# Patient Record
Sex: Male | Born: 1950 | Race: White | Hispanic: No | Marital: Married | Smoking: Never smoker
Health system: Southern US, Community
[De-identification: ages and names within clinical notes are randomized; demographics above are authoritative.]

## PROBLEM LIST (undated history)

## (undated) DIAGNOSIS — G47419 Narcolepsy without cataplexy: Secondary | ICD-10-CM

## (undated) DIAGNOSIS — R7303 Prediabetes: Secondary | ICD-10-CM

## (undated) DIAGNOSIS — K429 Umbilical hernia without obstruction or gangrene: Secondary | ICD-10-CM

## (undated) DIAGNOSIS — R140 Abdominal distension (gaseous): Secondary | ICD-10-CM

## (undated) DIAGNOSIS — M5430 Sciatica, unspecified side: Secondary | ICD-10-CM

## (undated) DIAGNOSIS — I10 Essential (primary) hypertension: Secondary | ICD-10-CM

## (undated) DIAGNOSIS — G4730 Sleep apnea, unspecified: Secondary | ICD-10-CM

## (undated) DIAGNOSIS — I4510 Unspecified right bundle-branch block: Secondary | ICD-10-CM

## (undated) DIAGNOSIS — E785 Hyperlipidemia, unspecified: Secondary | ICD-10-CM

## (undated) DIAGNOSIS — I2510 Atherosclerotic heart disease of native coronary artery without angina pectoris: Secondary | ICD-10-CM

## (undated) HISTORY — DX: Essential (primary) hypertension: I10

## (undated) HISTORY — PX: TONSILLECTOMY: SUR1361

## (undated) HISTORY — DX: Hyperlipidemia, unspecified: E78.5

## (undated) HISTORY — PX: ESOPHAGEAL DILATION: SHX303

## (undated) HISTORY — PX: HERNIA REPAIR: SHX51

## (undated) HISTORY — PX: CORONARY ARTERY BYPASS GRAFT: SHX141

## (undated) HISTORY — DX: Atherosclerotic heart disease of native coronary artery without angina pectoris: I25.10

## (undated) HISTORY — DX: Unspecified right bundle-branch block: I45.10

## (undated) HISTORY — DX: Sleep apnea, unspecified: G47.30

## (undated) HISTORY — PX: COLONOSCOPY W/ BIOPSIES AND POLYPECTOMY: SHX1376

## (undated) HISTORY — DX: Abdominal distension (gaseous): R14.0

## (undated) HISTORY — PX: MULTIPLE TOOTH EXTRACTIONS: SHX2053

---

## 1976-10-05 HISTORY — PX: NASAL SINUS SURGERY: SHX719

## 1999-06-06 ENCOUNTER — Ambulatory Visit (HOSPITAL_COMMUNITY): Admission: RE | Admit: 1999-06-06 | Discharge: 1999-06-06 | Payer: Self-pay | Admitting: Gastroenterology

## 1999-06-06 ENCOUNTER — Encounter (INDEPENDENT_AMBULATORY_CARE_PROVIDER_SITE_OTHER): Payer: Self-pay

## 2000-10-05 DIAGNOSIS — I2510 Atherosclerotic heart disease of native coronary artery without angina pectoris: Secondary | ICD-10-CM

## 2000-10-05 HISTORY — DX: Atherosclerotic heart disease of native coronary artery without angina pectoris: I25.10

## 2001-01-27 ENCOUNTER — Inpatient Hospital Stay (HOSPITAL_COMMUNITY): Admission: RE | Admit: 2001-01-27 | Discharge: 2001-02-01 | Payer: Self-pay | Admitting: Cardiology

## 2001-01-29 ENCOUNTER — Encounter: Payer: Self-pay | Admitting: Cardiothoracic Surgery

## 2001-01-30 ENCOUNTER — Encounter: Payer: Self-pay | Admitting: Thoracic Surgery (Cardiothoracic Vascular Surgery)

## 2001-03-04 ENCOUNTER — Encounter (HOSPITAL_COMMUNITY): Admission: RE | Admit: 2001-03-04 | Discharge: 2001-05-04 | Payer: Self-pay | Admitting: Cardiology

## 2001-05-05 ENCOUNTER — Encounter (HOSPITAL_COMMUNITY): Admission: RE | Admit: 2001-05-05 | Discharge: 2001-05-25 | Payer: Self-pay | Admitting: Cardiology

## 2004-12-05 ENCOUNTER — Encounter (INDEPENDENT_AMBULATORY_CARE_PROVIDER_SITE_OTHER): Payer: Self-pay | Admitting: Specialist

## 2004-12-05 ENCOUNTER — Ambulatory Visit (HOSPITAL_COMMUNITY): Admission: RE | Admit: 2004-12-05 | Discharge: 2004-12-05 | Payer: Self-pay | Admitting: Gastroenterology

## 2005-05-14 ENCOUNTER — Encounter: Payer: Self-pay | Admitting: Internal Medicine

## 2006-08-25 ENCOUNTER — Ambulatory Visit (HOSPITAL_BASED_OUTPATIENT_CLINIC_OR_DEPARTMENT_OTHER): Admission: RE | Admit: 2006-08-25 | Discharge: 2006-08-25 | Payer: Self-pay | Admitting: Surgery

## 2008-06-28 ENCOUNTER — Encounter: Payer: Self-pay | Admitting: Internal Medicine

## 2008-07-10 ENCOUNTER — Ambulatory Visit: Payer: Self-pay | Admitting: Internal Medicine

## 2008-07-10 DIAGNOSIS — G4733 Obstructive sleep apnea (adult) (pediatric): Secondary | ICD-10-CM | POA: Insufficient documentation

## 2008-07-10 DIAGNOSIS — J309 Allergic rhinitis, unspecified: Secondary | ICD-10-CM | POA: Insufficient documentation

## 2008-07-10 DIAGNOSIS — I10 Essential (primary) hypertension: Secondary | ICD-10-CM

## 2008-10-09 ENCOUNTER — Ambulatory Visit: Payer: Self-pay | Admitting: Internal Medicine

## 2010-03-11 ENCOUNTER — Ambulatory Visit: Payer: Self-pay | Admitting: Internal Medicine

## 2010-03-11 DIAGNOSIS — G47411 Narcolepsy with cataplexy: Secondary | ICD-10-CM

## 2010-05-07 ENCOUNTER — Telehealth: Payer: Self-pay | Admitting: Internal Medicine

## 2010-05-15 ENCOUNTER — Encounter: Payer: Self-pay | Admitting: Internal Medicine

## 2010-05-28 ENCOUNTER — Telehealth (INDEPENDENT_AMBULATORY_CARE_PROVIDER_SITE_OTHER): Payer: Self-pay | Admitting: *Deleted

## 2010-06-23 ENCOUNTER — Ambulatory Visit (HOSPITAL_BASED_OUTPATIENT_CLINIC_OR_DEPARTMENT_OTHER)
Admission: RE | Admit: 2010-06-23 | Discharge: 2010-06-23 | Payer: Self-pay | Source: Home / Self Care | Admitting: Internal Medicine

## 2010-06-24 ENCOUNTER — Encounter: Payer: Self-pay | Admitting: Internal Medicine

## 2010-06-28 ENCOUNTER — Ambulatory Visit: Payer: Self-pay | Admitting: Internal Medicine

## 2010-07-11 ENCOUNTER — Encounter: Payer: Self-pay | Admitting: Internal Medicine

## 2010-08-12 ENCOUNTER — Telehealth (INDEPENDENT_AMBULATORY_CARE_PROVIDER_SITE_OTHER): Payer: Self-pay | Admitting: *Deleted

## 2010-11-06 NOTE — Progress Notes (Signed)
Summary: ? why was PFT sched- LMTCB x 1 -spouse returned call  Phone Note Call from Patient Call back at Home Phone [phone]   Caller: Franki Cabot (wife) Call For: young Summary of Call: patient was scheduled for a pft on 11/10.  pts wife did not know anything about it. she said she talked to libby friday and libby was gonna call her back before this was scheduled. she didnt know this was approved by insurance or anything. patient doesnt know why he is sched for a pft either. no one told him he needed one.  the wife would like to talk to libby about this since she talked to libby friday.  Initial call taken by: Valinda Hoar,  August 12, 2010 4:53 PM  Follow-up for Phone Call        I am unsure who ordered the PFT or why it was sched- looks like Lib sched this for pt on 08/08/10 and comments in IDX state something about the pt passing out last time?? I called spouse and had to [facility]  August 12, 2010 5:07 PM  [facility]Michel Bickers [facility]  August 13, 2010 11:26 AM   Additional Follow-up for Phone Call Additional follow up Details #1::        not sure what pt's wife needs. says sleep study was code wrong but needs to rsc pft (but not before she speaks to libby re: prior [place]). call dellie Caris at home #. caller understands that she may not get a call back today. Tivis Ringer, CNA  August 14, 2010 5:03 PM    Additional Follow-up for Phone Call Additional follow up Details #2::    spoke to wife she is aware that pt was not to be set up for pfts we sent info to ins to help cover the mslt he had done back in aug and wife was confused over another phone call she got  Follow-up by: Oneita Jolly,  August 14, 2010 5:17 PM

## 2010-11-06 NOTE — Medication Information (Signed)
Summary: Prior [employer] for Methylphenidate/[facility]  Prior [facility] for Methylphenidate/[facility]   Imported By: Sherian Rein 10/09/2010 14:43:08  _____________________________________________________________________  External Attachment:    Type:   Image     Comment:   External Document

## 2010-11-06 NOTE — Assessment & Plan Note (Signed)
Summary: Aaron Humphrey/need new equip/apc   Copy to:  Dr. Tally Joe Primary Provider/Referring Provider:  Dr. Tally Joe  CC:  Follow up-Aaron; needs new CPAP machine([facility]).  History of Present Illness: 07/10/08- OSA, allergic rhinitis Didn't like sustained action Ritalin, preferring to work with short actiing form. Trying to maintain good Aaron hygiene  and toget adequate Aaron. Incidental skin bump left face, from cpap- to see a dermatologist. We discussed skin care, pressure. Denies chest pain, palpitation, syncope.  March 11, 2010- OSA, allergic rhinits, CAD/ CABG.....wife here Continues CPAP 13, [facility]- he uses it all night every night and quality of life is improved. He uses Provigil on weekdays and the methylphenidate 10  on weekends. He tolerates these well. Wife describes him as "cloudy in the afternoon". Meds helpful on long drives..  Denies chest pain, palpitation, syncope, mood change.     Current Medications (verified): 1)  Atenolol 25 Mg Tabs (Atenolol) .Marland Kitchen.. 1 Once Daily 2)  Provigil 200 Mg Tabs (Modafinil) .Marland Kitchen.. 1 Once Daily 3)  Lipitor 40 Mg Tabs (Atorvastatin Calcium) .... At Bedtime 4)  Niaspan 1000 Mg Cr-Tabs (Niacin (Antihyperlipidemic)) .... Take 2 By Mouth At Bedtime 5)  Aspirin Adult Low Strength 81 Mg Tbec (Aspirin) .... 2 At Bedtime 6)  Aleve 220 Mg Caps (Naproxen Sodium) .... As Needed 7)  Metadate Cd 10 Mg Cr-Caps (Methylphenidate Hcl) .... Take 1-4 Once Daily As Needed 8)  Cpap .... Ahc Set On 13  Allergies (verified): 1)  ! 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 - 2002 inquinal hernia repair esophageal stricture dilatation x 3  Family History: Last updated: 07/10/2008 Father had a stroke  Social  History: Last updated: 07/10/2008 Never smoker No ETOH Married  No children  Review of Systems      See HPI  The patient denies shortness of breath with activity, shortness of breath at rest, productive cough, non-productive cough, coughing up blood, chest pain, irregular heartbeats, acid heartburn, indigestion, loss of appetite, weight change, abdominal pain, difficulty swallowing, sore throat, tooth/dental problems, headaches, nasal congestion/difficulty breathing through nose, and sneezing.    Vital Signs:  Patient profile:   60 year old male Height:      74 inches Weight:      201 pounds BMI:     25.90 O2 Sat:      97 % on Room air Pulse rate:   60 / minute BP sitting:   122 / 74  (left arm) Cuff size:   regular  Vitals Entered By: Reynaldo Minium CMA (March 11, 2010 2:56 PM)  O2 Flow:  Room air  Physical Exam  Additional Exam:  General: A/Ox3; pleasant and cooperative, NAD, tall, lean man SKIN: clear NODES: no lymphadenopathy HEENT: [place]/AT, EOM- WNL, Conjuctivae- clear, PERRLA, TM-WNL, Nose- red mucoid edema without polyps or erosion., Throat- clear and wnl, Mallampati  II NECK: Supple w/ fair ROM, JVD- none, normal carotid impulses w/o bruits Thyroid- normal to palpation CHEST: Clear to P&A HEART: RRR, no m/g/r heard, puls slow for taking stimulant. ABDOMEN: Soft and nl;  ZOX:WRUE, nl pulses, no edema  NEURO: Grossly intact to observation      Impression & Recommendations:  Problem # 1:  Aaron APNEA (ICD-780.57)  Obstructive Aaron apnea- pressure has been appropriate but he needs replacement  CPAP machine  and supples  Problem # 2:  HYPERSOMNIA (ICD-780.54)  This is probably but not defintiesly secondary to his OSA. Aaron hygiene seems adequate. We discussed use of Provigil and metadate carefully in context of his CAD. Use seems appropriate. He will try Nuvigil for comparison.  Medications Added to Medication List This Visit: 1)  Nuvigil 250 Mg Tabs  (Armodafinil) .Marland Kitchen.. 1 daily as needed 2)  Niaspan 1000 Mg Cr-tabs (Niacin (antihyperlipidemic)) .... Take 2 by mouth at bedtime 3)  Metadate Cd 10 Mg Cr-caps (Methylphenidate hcl) .... Take 1-4 once daily as needed 4)  Cpap  .... Ahc set on 13 5)  Cpap 13 Replacement, Humidifer, Mask of Choice, Supplies   Other Orders: Est. Patient Level IV (16109) DME Referral (DME)  Patient Instructions: 1)  Please schedule a follow-up appointment in 1 year. 2)  See [facility] for help as needed getting repacement CPAP machine, mask and supplies 3)  Try sample/ script/ discount card for Nuvigil 250 mg- 1 daily if needed 4)  Refill for Metadate Prescriptions: NUVIGIL 250 MG TABS (ARMODAFINIL) 1 daily as needed  #30 x 5   Entered and Authorized by:   Waymon Budge MD   Signed by:   Waymon Budge MD on 03/11/2010   Method used:   Print then Give to Patient   RxID:   [id number] METADATE CD 10 MG CR-CAPS (METHYLPHENIDATE HCL) take 1-4 once daily as needed  #120 x 0   Entered and Authorized by:   Waymon Budge MD   Signed by:   Waymon Budge MD on 03/11/2010   Method used:   Print then Give to Patient   RxID:   [id number]

## 2010-11-06 NOTE — Letter (Signed)
Summary: [facility]  [facility]   Imported By: Lester [place] 07/01/2010 08:14:22  _____________________________________________________________________  External Attachment:    Type:   Image     Comment:   External Document

## 2010-11-06 NOTE — Progress Notes (Signed)
Summary: sleep study  Phone Note Call from Patient Call back at Home Phone [phone]   Caller: Spouse Call For: young Summary of Call: Checking on the status of order for sleep study. Initial call taken by: Darletta Moll,  May 28, 2010 9:43 AM  Follow-up for Phone Call        Spoke with pt's spouse.  She states that she and pt were unaware of appt for mslt- I advised that this has been sched for 9/20 at 7 am at [facility].  She states that it was supposed to be at [facility], and also that this date will not work for pt.  Wants to speak with [facility] to make sure it's not supposed to be at [facility] and also to resched.  Will forward to [facility], thanks Follow-up by: Vernie Murders,  May 28, 2010 10:09 AM  Additional Follow-up for Phone Call Additional follow up Details #1::        wife aware the appt is 06/24/10 unless sleep ctr changes it  Additional Follow-up by: Oneita Jolly,  May 28, 2010 11:21 AM

## 2010-11-06 NOTE — Progress Notes (Signed)
Summary: prior auth  Phone Note Call from Patient Call back at [phone] cell   Caller: Patient Call For: young Reason for Call: Talk to Nurse Summary of Call: Nuvigil needs prior authorization and metholtheodate written on Kune 7.2011 - pt has new insurance and needs to go thru [facility] [phone] Name of pt and DOB. Initial call taken by: Eugene Gavia,  May 07, 2010 2:38 PM  Follow-up for Phone Call        called [employer] and requested PA for these meds.  Case # for Ronne Binning is [id number]  case # for methylphenidate is [id number]  waiting on fax from [facility] with PA paperwork for CY to fill out.   Aundra Millet Reynolds LPN  May 07, 2010 4:41 PM   received paperwork and gave to [facility] to give to CY to fill out.  Arman Filter LPN  May 08, 2010 8:47 AM   Additional Follow-up for Phone Call Additional follow up Details #1::        Done Additional Follow-up by: Waymon Budge MD,  May 09, 2010 9:15 AM     Appended Document: prior auth I gave him nuvigil to try. He needs to decide if he wants that or his methylphenidate, but not both.  Appended Document: prior auth LMTCB.  Appended Document: prior auth Pt returned my call  and requested that I call him back at his home number; attempted to call and unable to reach pt or leave message. Also, attempted to call pt at his cell number provided; unable to reach pt-LMTCB.   Appended Document: prior Berkley Harvey Spoke with patients spouse; states that patient has always taken Methylphenidate and Provigil together-until CDY changed Provigil to Nuvigil; Wife states that patient needs the Provigil RX back as it works better and needs to continue taking Methylphenidate as it helps when patient is driving/working. Will speak to CDY about this and obtain PA for med if needed.    Pts wife would like to have 30 day rx sent to [facility] and 90 day supply to [facility].  Appended Document: prior auth-Rx's for Provigil Spoke with CDY-ok to let  pt have RX's for Provigil again 30 day and 90 day Rx's printed. Also, will start PA process for Methylphenidate Rx.  Pt take both Provigil and Methylphenidate due to his work; takes provigil on weekends.Reynaldo Minium CMA  May 14, 2010 4:43 PM   Clinical Lists Changes  Medications: Changed medication from NUVIGIL 250 MG TABS (ARMODAFINIL) 1 daily as needed to PROVIGIL 200 MG TABS (MODAFINIL) take 1 by mouth once daily - Signed Rx of PROVIGIL 200 MG TABS (MODAFINIL) take 1 by mouth once daily;  #30 x 0;  Signed;  Entered by: Reynaldo Minium CMA;  Authorized by: Waymon Budge MD;  Method used: Print then Give to Patient Rx of PROVIGIL 200 MG TABS (MODAFINIL) take 1 by mouth once daily;  #90 x 3;  Signed;  Entered by: Reynaldo Minium CMA;  Authorized by: Waymon Budge MD;  Method used: Print then Give to Patient    Prescriptions: PROVIGIL 200 MG TABS (MODAFINIL) take 1 by mouth once daily  #90 x 3   Entered by:   Reynaldo Minium CMA   Authorized by:   Waymon Budge MD   Signed by:   Reynaldo Minium CMA on 05/14/2010   Method used:   Print then Give to Patient   RxID:   [id number] PROVIGIL 200 MG TABS (MODAFINIL) take 1  by mouth once daily  #30 x 0   Entered by:   Reynaldo Minium CMA   Authorized by:   Waymon Budge MD   Signed by:   Reynaldo Minium CMA on 05/14/2010   Method used:   Print then Give to Patient   RxID:   [id number]    Appended Document: prior auth received denial letter and appeal form from [employer].  stapled "packet" has been placed on CDY's cart and this append forwarded to Katie's box, who is aware of the letter/form.  Appended Document: prior auth denial came through and states pt needs to have MSLT-pt has agreed to do this. CDY has been notified of this to place order.  Appended Document: Orders Update Katie called patient- expained insurance won't cover Nuvigil/ methylphenidate for dx of hypersomnia w/ OSA. We need to establish separate dx of residual  primary hypersomnia after treatment for OSA by doing MSLT. He agrees.  MSLT scheduled for 06/24/10. wife aware of appt. Clinical Lists Changes  Orders: Added new Referral order of Sleep Disorder Referral (Sleep Disorder) - Signed

## 2010-11-06 NOTE — Medication Information (Signed)
Summary: Methylphenidate Denied/[facility]  Methylphenidate Denied/[facility]   Imported By: Sherian Rein 10/09/2010 14:45:58  _____________________________________________________________________  External Attachment:    Type:   Image     Comment:   External Document

## 2010-12-18 ENCOUNTER — Telehealth: Payer: Self-pay | Admitting: Internal Medicine

## 2010-12-23 NOTE — Progress Notes (Signed)
Summary: pt needs new prescription for Metadate CD 10 mg--lmomtcb  Phone Note Call from Patient   Caller: Patient Call For: Aaron Humphrey Summary of Call: Patient phoned stated that Dr Maple Hudson gave him a prescription for Metadhate CD 10 MG in June but he had so many problems with his insurance he never had this filled. Now that he has had the sleep study he feels that he needs this medication but it requires prior auth from the doctors office. The new prescription needs to be sent to [facility]. Patient can be reached at [phone] Initial call taken by: Vedia Coffer,  December 18, 2010 10:29 AM  Follow-up for Phone Call        Looks like this was denied by [facility] on 05/15/2010 due to pt not having a dx of ADD/ADHD, narcolepsy, idiopathis hypersomnolence, MS related fatigue or depression. Pt had a sleep study done and looks like he may be on cpap. LMOMTCB X1 for pt to find out more info [facility]  December 18, 2010 11:30 AM   Additional Follow-up for Phone Call Additional follow up Details #1::        Patient calling back.  He is going to lunch won't be able to be reached until 1:15pm [employer]  December 18, 2010 12:04 PM lmomtcb Carver Fila  December 18, 2010 1:43 PM     Additional Follow-up for Phone Call Additional follow up Details #2::    called and spoke with pt.  pt is requesting rx for Metadhate CD 10 MG be sent to [facility] pharmacy. Pt states this was previously denied by ins but pt states he had recent sleep study and now believes ins will pay for rx.  will forward message to CY to address.Marland Kitchen Marland KitchenAundra Millet Reynolds LPN  December 18, 2010 2:39 PM    Per CDY-ok to copy previous script; please bring Rx to me and I will take care of the rest of the message as CDY needs sleep study. Thanks.Reynaldo Minium CMA  December 18, 2010 3:30 PM   Per TD have Florentina Addison print off the script and have cdy to fill out. FYI pt wants rx to go to [facility] ? 90 day supply Mindy Edward Jolly  December 18, 2010 4:08 PM   Additional Follow-up for Phone  Call Additional follow up Details #3:: Details for Additional Follow-up Action Taken: Rx printed and placed on CDY's cart to have signed then faxed to [facility]; also placed sleep study with message for CDY to review.Reynaldo Minium CMA  December 19, 2010 2:52 PM   OK- cdy  Prescriptions: METADATE CD 10 MG CR-CAPS (METHYLPHENIDATE HCL) take 1-4 once daily as needed  #120 x 0   Entered by:   Reynaldo Minium CMA   Authorized by:   Waymon Budge MD   Signed by:   Reynaldo Minium CMA on 12/19/2010   Method used:   Print then Give to Patient   RxID:   [id number]

## 2011-01-02 ENCOUNTER — Telehealth: Payer: Self-pay | Admitting: Internal Medicine

## 2011-01-02 MED ORDER — METHYLPHENIDATE HCL ER (CD) 10 MG PO CPCR: ORAL_CAPSULE | ORAL | Status: DC

## 2011-01-02 NOTE — Telephone Encounter (Signed)
Rx was printed and placed on CDY's cart to be signed.

## 2011-01-02 NOTE — Telephone Encounter (Signed)
Spoke with Aaron Humphrey-aware of Rx at front for pick up.Aaron Humphrey

## 2011-01-05 ENCOUNTER — Telehealth: Payer: Self-pay | Admitting: *Deleted

## 2011-01-05 NOTE — Telephone Encounter (Signed)
Ms Weick returning Lori's call. She will be here this afternoon to pick up written prescription for Shanda Howells.Vedia Coffer

## 2011-01-05 NOTE — Telephone Encounter (Signed)
Follow-up on phone call from 12/22/2010 ( msg was still in the triage box in EMR). Tried to contact the patient to see if anything further is needed on Ritalin RX. Is PA needed and which pharmacy did he take RX to?

## 2011-01-05 NOTE — Telephone Encounter (Signed)
lmomtcb x1 

## 2011-01-08 NOTE — Telephone Encounter (Signed)
lmomtcb x1 

## 2011-01-08 NOTE — Telephone Encounter (Signed)
Per [name], she spoke with [facility] and they informed her that there should be no problem filling the RX for Ritalin. Aaron Humphrey has placed RX up front for pt or [facility] to pick up.

## 2011-01-08 NOTE — Telephone Encounter (Signed)
DELI PHONED STATED THAT SHE WAS RETURNING A CALL TO LORI. SHE CAN BE REACHED AT [phone].Vedia Coffer

## 2011-01-19 ENCOUNTER — Telehealth: Payer: Self-pay | Admitting: Internal Medicine

## 2011-01-19 NOTE — Telephone Encounter (Signed)
APPROVED. Case # [id number]. Approval dates: 12/29/10 to 01/19/12. Judeth Cornfield with [facility] aware.

## 2011-01-20 ENCOUNTER — Telehealth: Payer: Self-pay | Admitting: Internal Medicine

## 2011-01-20 NOTE — Telephone Encounter (Signed)
Spoke with pt wife and she states the rx they got was for capsules and it will cost them $50, but they can get methylphenidate tablets for $10 so she is requesting a new rx be mailed to them for tablets. She states the pharmacy has the copy of rx for methylphenidate capsules so we can call and cancel this rx. Please advise if ok to print new RX for methylphenidate 10mg  tablets. Carron Curie, CMA

## 2011-01-21 MED ORDER — METHYLPHENIDATE HCL ER 10 MG PO TBCR: EXTENDED_RELEASE_TABLET | ORAL | Status: DC

## 2011-01-21 NOTE — Telephone Encounter (Signed)
RX printed and placed on CDY cart for signature. LMOMTCB. Need to know if they want this mailed or would like to pick this up.

## 2011-01-21 NOTE — Telephone Encounter (Signed)
Spoke with pt's wife.  She is requesting rx to be mailed to home address.  I verified it is correct in pt's chart.

## 2011-01-21 NOTE — Telephone Encounter (Signed)
Per CDY-okay to do this change.

## 2011-01-21 NOTE — Telephone Encounter (Signed)
Returning triage's call.

## 2011-01-22 NOTE — Telephone Encounter (Signed)
I have mailed Rx as requested to patients home address.

## 2011-01-28 ENCOUNTER — Telehealth: Payer: Self-pay | Admitting: Internal Medicine

## 2011-01-28 NOTE — Telephone Encounter (Signed)
ATC # provided - NA and unable to leave message.  WCB. 

## 2011-01-29 NOTE — Telephone Encounter (Signed)
lmomtcb x1 

## 2011-01-29 NOTE — Telephone Encounter (Signed)
Pt's spouse states [facility] would not fill the new rx for ritalin tablets and says it will need another authorization. I will contact [facility] on [address] to see what the problem is exactly.  [facility] on [address] states that when they ran the rx on 4/22 it still needed a PA. I called [employer] and they assured me that the ritalin does not need a new PA. Pt's spouse is aware and will take the rx back to [facility] and have the pharmacist contact [employer] if there is an issue. She will call us back if necessary.

## 2011-02-04 ENCOUNTER — Telehealth: Payer: Self-pay | Admitting: Internal Medicine

## 2011-02-04 DIAGNOSIS — G4733 Obstructive sleep apnea (adult) (pediatric): Secondary | ICD-10-CM

## 2011-02-04 NOTE — Telephone Encounter (Signed)
Last OV 03/11/2010. Unable to reach pt. Pls advise if okay to send order for new cpap mask, head gear and tubing ro [facility].

## 2011-02-04 NOTE — Telephone Encounter (Signed)
Left a detailed msg to let the pt know we have sent an order for new mask and cpap supplies to [facility] and to call if he has any further problems or questions.

## 2011-02-04 NOTE — Telephone Encounter (Signed)
PER CDY okay to send order-replacement CPAP [place] of choice and supplies dx OSA.

## 2011-02-05 ENCOUNTER — Telehealth: Payer: Self-pay | Admitting: Internal Medicine

## 2011-02-05 NOTE — Telephone Encounter (Signed)
Spoke with [place] at [facility]. Order was just placed yesterday 02/04/11 at 2:30 pm. Order was p/u by Mayra Reel for [facility] this morning. Normally we allow 48-72 hours for orders to be processed. Spoke with [place] and she stated that according to what she was told this morning when she faxed order in was that the patient was being contacted.  Returned phone call to patient and went directly to voice mail. LMOAM for pt that order has been received by [facility] and to call me back if he has not been contacted to arrange for new cpap mask by [facility].

## 2011-02-05 NOTE — Telephone Encounter (Signed)
Called pt back and spoke with him. [facility] did contact pt today and is mailing his cpap mask out to him. Advised pt that I would close this message out and if he needed [place] to call us back.

## 2011-02-05 NOTE — Telephone Encounter (Signed)
Spoke w/ pt and he states [facility] advised him they have not received any orders for pt cpap mask. I advised pt it show's we sent them the information yesterday. Pt states they advised him they have not received anything from [place]. Please advise PCC. Thanks  Carver Fila, CMA

## 2011-02-20 NOTE — Op Note (Signed)
[place]. [facility]  Patient:    Aaron Humphrey, Aaron Humphrey                   MRN: [id number] Proc. Date: 01/28/01 Adm. Date:  [id number] Attending:  Waldo Laine CC:         Armanda Magic, M.D.   Operative Report  PREOPERATIVE DIAGNOSIS:  Coronary occlusive disease.  POSTOPERATIVE DIAGNOSIS:  Coronary occlusive disease.  PROCEDURE:  Coronary artery bypass grafting x 3 with the left internal mammary to the left anterior descending coronary artery, right internal mammary to the right coronary artery, and reversed saphenous vein graft to the diagonal coronary artery.  SURGEON:  Gwenith Daily. Tyrone Sage, M.D.  FIRST ASSISTANT:  Salvatore Decent. Cornelius Moras, M.D.  SECOND ASSISTANT:  Maxwell Marion, R.N.F.A.  BRIEF HISTORY:  Patient is a 60 year old male with three-vessel coronary artery disease and positive stress test, who presented with new onset of anginal symptoms.  Cardiac catheterization revealed total occlusion of the left anterior descending coronary artery and high-grade stenosis of the diagonal coronary artery and the right coronary artery.  The patient had a large circumflex system with a dominant obtuse marginal supplying the lateral wall without significant disease.  A smaller posterolateral branch was diseased.  Consideration for bypassing this was undertaken, but at the time of surgery the vessel was found to be very small as it arose from the AV groove. Risks and options were discussed with the patient and his wife in detail, and signed informed consent.  DESCRIPTION OF PROCEDURE:  With Swan-Ganz and arterial line monitors in place, the patient underwent general endotracheal anesthesia without incident.  Skin of the chest and legs was prepped with Betadine and draped in the usual sterile manner.  A short segment of vein was harvested from the right lower extremity.  Median sternotomy was performed.  The left internal mammary artery was dissected down  as a pedicle graft.  The distal artery was divided, had good, free flow.  In a similar fashion, the right internal mammary artery was dissected down as a pedicle graft and had good, free flow.  Pericardium was opened.  Overall ventricular function appeared preserved.  The patient was systemically heparinized.  The ascending aorta and the right atrium were cannulated, and the aortic root vent cardioplegia needle was introduced into the ascending aorta.  The patient was placed on cardiopulmonary bypass 2.4 L/min. per sq m.  Sites of anastomosis were selected and dissected out of the epicardium.  As noted, the posterolateral branch arising off the distal circumflex was considered for bypass but was found to be very small and not suitable for bypass.  The right coronary artery past the takeoff of the acute marginal was selected.  It was not a large vessel but did admit a 1.5 mm probe.  Consideration for sequential of the left internal mammary artery to the diagonal and LAD was considered, but because of the high diagonal coronary, this was felt not suitable.  The patients body temperature was cooled to 30 degrees.  Aortic crossclamp was applied.  Five hundred cubic centimeters of cold blood potassium cardioplegia was administered with rapid diastolic arrest of the heart.  Myocardial septal temperature was monitored throughout the crossclamp period.  Attention was turned first to the diagonal coronary artery, which was opened and admitted a 1.5 mm probe distally.  Using a running 7-0 Prolene, distal anastomosis was performed with a segment of reversed saphenous vein graft.  Attention was then turned to the  right coronary artery past the takeoff of the acute marginal vessel.  This was opened and was a small vessel but did admit a 1.5 mm probe distally.  Using a running 8-0 Prolene, the right internal mammary artery as a pedicle graft was anastomosed to the right coronary artery.  In a similar  fashion, the left internal mammary artery was anastomosed to the left anterior descending coronary artery, which was opened in the midportion, and the vessel was approximately 1.5 mm in size.  With release of the Edwards bulldog on the mammary artery, there was appropriate rise in the myocardial septal temperature and prompt filling of the inferior surface of the heart.  Aortic crossclamp was removed.  Total crossclamp time:  57 minutes.  The patient spontaneously converted to a sinus rhythm.  Partial occlusion clamp was placed on the ascending aorta.  A single punch aortotomy was performed, and the vein graft to the diagonal was anastomosed to the ascending aorta.  Air was evacuated from the graft, and the partial occlusion clamp was removed.  Sites of anastomosis were inspected for any bleeding with the patient rewarmed.  He was ventilated and weaned from cardiopulmonary bypass without difficulty.  He remained hemodynamically stable and was decannulated in the usual fashion. Protamine sulfate was administered with the operative field hemostatic.  Two atrial and two ventricular pacing wires were applied.  Graft marker was applied.  Left pleural tube, right pleural tube, two mediastinal tubes were left in place.  Pericardium was loosely reapproximated.  Sternum closed with #6 stainless steel wire, fascia closed with interrupted 0 Vicryl, running 3-0 Vicryl in the subcutaneous tissue, 4-0 subcuticular stitch in the skin edges. Dry dressings were applied.  Sponge and needle count was reported as correct at completion of procedure.  Patient tolerated the procedure without obvious complication and was transferred to the surgical intensive care unit for further postoperative care. DD:  01/29/01 TD:  01/31/01 Job: [id number]

## 2011-02-20 NOTE — Cardiovascular Report (Signed)
[place]. [facility]  Patient:    Aaron Humphrey, Aaron Humphrey                   MRN: [id number] Proc. Date: 01/27/01 Adm. Date:  [id number] Disc. Date: [id number] Attending:  Waldo Laine CC:         Meredith Staggers, M.D.   Cardiac Catheterization  REFERRING PHYSICIAN:  Meredith Staggers, M.D.  HISTORY OF PRESENT ILLNESS:  This is a 60 year old male who has had exertional chest pain and recently had a stress test positive for ischemia.  He now presents for cardiac catheterization.  PROCEDURES: 1. Left heart catheterization. 2. Coronary angiography. 3. Left ventriculography.  OPERATOR:  Armanda Magic, M.D.  INDICATIONS:  Chest pain and abnormal Cardiolite.  COMPLICATIONS:  None.  INTRAVENOUS MEDICATIONS:  Versed 2 mg.  DESCRIPTION OF PROCEDURE:  The patient was brought to the cardiac catheterization laboratory in a fasting, nonsedated state.  Informed consent was obtained.  The patient was connected to continuous heart rate and pulse oximetry monitoring and blood pressure monitoring.  The right groin was prepped and draped in sterile fashion.  One percent Xylocaine was used for local anesthesia.  Using the modified Seldinger technique, a 6 French sheath was placed in the right femoral artery.  Under fluoroscopic guidance, a 6 [place] JL4 catheter was placed in the left coronary artery.  Cine films were taken in the 30 degree RAO and 40 degree LAO views.  This catheter was then exchanged out over a guide wire for a 6 [place] JR4 catheter which was placed in the right coronary artery.  Cine films were taken in the 30 degree RAO and 40 degree LAO views.  This catheter was then exchanged out for a 6 French angled pigtail catheter which was placed in the left ventricular cavity.  Left ventriculography was performed in the 30 degree RAO view.  The catheter was then pulled back close to the valve.  There was no significant valvular gradient.  At the  end of the procedure, all catheters and sheaths were removed.  Manual pressure was performed until adequate hemostasis was obtained. The patient was transferred back to his room in stable condition.  RESULTS:  The left main coronary artery is widely patent and bifurcates into the left anterior descending and left circumflex arteries.  The left anterior descending artery has a proximal 80-90% narrowing prior to the takeoff of the first diagonal, which is widely patent.  The LAD then gives rise to a very large second diagonal branch and then is occluded immediately after the hospital at.  The second large diagonal branch has an 80% proximal narrowing before a branch and an 80-90% narrowing after a branching of the vessel. There is evidence of left-to-left collaterals to the distal LAD from the left circumflex system.  The left circumflex gives rise to a high obtuse marginal ______ are slightly patent.  The left circumflex is widely patent throughout its mid course and then gives off a second large obtuse marginal branch which is widely patent.  There is a 70% narrowing at the distal portion of the left circumflex artery.  The right coronary artery gives rise to an acute marginal branch and there is a 60% narrowing after that.  The distal RCA bifurcates into a posterior descending artery and posterolateral artery.  Left ventriculography performed in the RAO view showed an EF of 50% with mild focal hypokinesis.  The left ventricular pressure was 124/90 mmHg.  The aortic  pressure was 121/73 mmHg.  IMPRESSION: 1. Obstructive disease of the proximal left anterior descending with severe    stenosis and then occluded left anterior descending in the mid portion.    There was a very distal left circumflex stenosis as well.  There was also    borderline obstructive disease in the mid right coronary artery. 2. Mildly reduced left ventricular function. 3. Hyperlipidemia. 4. Hypertension.  PLAN:   The film was ready to be reviewed with Celso Sickle, M.D., for potential PCI versus CABG.  Will start Zocor 20 mg a day for LDL of 137.  Will continue Altace for hypertension. DD:  04/18/01 TD:  04/18/01 Job: [id number]

## 2011-02-20 NOTE — Op Note (Signed)
NAME:  LORN, BUTCHER NO.:  [id number]   MEDICAL RECORD NO.:  [id number]          PATIENT TYPE:  AMB   LOCATION:  ENDO                         FACILITY:  [facility]   PHYSICIAN:  John C. Madilyn Fireman, M.D.    DATE OF BIRTH:  29-Oct-1950   DATE OF PROCEDURE:  12/05/2004  DATE OF DISCHARGE:                                 OPERATIVE REPORT   PROCEDURE:  Colonoscopy with polypectomy.   INDICATIONS FOR PROCEDURE:  Personal history of colon polyps and family  history of colon cancer.   DESCRIPTION OF PROCEDURE:  The patient was placed in the left lateral  decubitus position then placed on the pulse monitor with continuous low-flow  oxygen delivered by nasal cannula. He was sedated with 2 mg IV Versed in  addition to medicine given for the previous EGD. The Olympus video  colonoscope was inserted into the rectum and advanced to cecum, confirmed by  transillumination at McBurney's point and visualization of ileocecal valve  and appendiceal orifice. The prep was fairly good but I cannot rule out  small lesions less 1 cm in all areas otherwise the cecum, ascending, and  transverse colon appeared normal with no masses, polyps, diverticula or  other mucosal abnormalities. Within the descending colon, there was seen a 6-  8 mm sessile polyp that was fulgurated by hot biopsy and the remainder the  descending, sigmoid and rectum appeared normal. The scope was then withdrawn  and the patient returned to the recovery room in stable condition. He  tolerated the procedure well and there were no immediate complications.   IMPRESSION:  Descending colon polyp otherwise normal study.   PLAN:  Await histology and probably repeat colonoscopy in five years.      JCH/MEDQ  D:  12/05/2004  T:  12/05/2004  Job:  [id number]   cc:   Meredith Staggers, M.D.  [address], [address]  [place]  [place] [address]  Fax: [phone]

## 2011-02-20 NOTE — Discharge Summary (Signed)
[place]. [facility]  Patient:    Aaron Humphrey, Aaron Humphrey                   MRN: [id number] Adm. Date:  [id number] Disc. Date: 02/01/01 Attending:  Waldo Laine Dictator:   Maxwell Marion, RNFA CC:         Meredith Staggers, M.D.  Armanda Magic, M.D.   Discharge Summary  DATE OF BIRTH:  [birth year]  DATE OF SURGERY:  January 28, 2001  ADMISSION DIAGNOSIS:  Exertional chest pain, positive Cardiolite stress test.  PAST MEDICAL HISTORY: 1. Hypertension. 2. Hypercholesterolemia. 3. History of sleep apnea. 4. History of narcolepsy.  ALLERGIES:  No known drug allergies.  DISCHARGE DIAGNOSIS:  Two-vessel coronary artery disease, status post coronary artery bypass grafting.  HOSPITAL COURSE:  On January 27, 2001, Aaron Humphrey was admitted to [facility] by Dr. Armanda Magic for a cardiac catheterization.  Aaron procedure revealed three-vessel coronary artery disease, with an ejection fraction of approximately 50%.  It was determined that his lesions were not amenable to percutaneous angioplasty, and cardiac surgery consult was obtained with Dr. Sheliah Plane.  Dr. Tyrone Sage recommended coronary artery bypass grafting for treatment choice for Aaron Humphrey.  On January 28, 2001, Doppler studies were performed which revealed no significant carotid artery disease.  Aaron Humphrey was noted to have bilateral palpable pedal pulses.  On January 28, 2001, Aaron Humphrey underwent an uncomplicated coronary artery bypass grafting x 2 by Dr. Tyrone Sage.  Grafts placed at the time of procedure were left internal mammary artery to the LAD, saphenous vein was grafted to the diagonal artery, saphenous vein was grafted to the distal right coronary artery.  At the conclusion of the procedure Aaron Humphrey was transferred in stable condition to the SICU.  Of note, Aaron Humphrey had some elevated blood glucose levels during his hospital stay, and hemoglobin A1C level was drawn which came back at  5.0. Aaron Humphrey postoperative course has been uneventful.  Aaron Humphrey has made excellent progress since his surgery, and Aaron Humphrey is anticipated ready for discharge tomorrow, February 01, 2001.  CONDITION ON DISCHARGE:  Improved.  DISCHARGE INSTRUCTIONS:  Include medication, activity, diet, wound care, and follow-up instructions.  Please see the discharge instruction sheet for details.  DISCHARGE MEDICATIONS: 1. Enteric-coated aspirin 325 mg p.o. q.d. 2. Tylox 1-2 p.o. q.4-6h. p.r.n. 3. Lopressor 25 mg b.i.d. 4. Folic acid 1 mg p.o. q.d. 5. Ferrous sulfate 325 mg p.o. q.d. 6. Zocor 20 mg p.o. q.d. 7. Aaron Humphrey has been instructed to resume his home medication of Altace 2.5 mg p.o.    q.d.  FOLLOW-UP:  Aaron Humphrey has been instructed to call Dr. Fernanda Drum office and arrange for an appointment in two weeks.  Aaron Humphrey will have a chest x-ray taken at that time.  Aaron Humphrey also has an appointment to see Dr. Tyrone Sage at the [facility] office on Thursday, Feb 24, 2001. DD:  01/31/01 TD:  02/01/01 Job: [id number]

## 2011-02-20 NOTE — Consult Note (Signed)
[place]. [facility]  Patient:    Aaron Humphrey, Aaron Humphrey                   MRN: [id number] Proc. Date: 01/27/01 Adm. Date:  [id number] Attending:  Waldo Laine CC:         Armanda Magic, M.D.   Consultation Report  REFERRING PHYSICIAN:  Armanda Magic, M.D.  FOLLOW-UP CARDIOLOGIST:  Armanda Magic, M.D.  PRIMARY CARE PHYSICIAN:  Meredith Staggers, M.D.  REASON FOR CONSULTATION:  Coronary artery disease.  HISTORY OF PRESENT ILLNESS:  The patient is a 60 year old male who, for the past two months, had noted exertionally related chest pain, sometimes with minor exertion such as climbing stairs, other times while doing yard work. The pain was a chest tightness starting in the mid sternum and radiating slightly to the right side, associated with some exertional shortness of breath, but denies any nausea, vomiting or radiation into the arm.  The patient notes that the pain will resolve, even if continuing to exercise.  He has no previous history of myocardial infarction, angioplasty or previous cardiac surgery.  A recent stress test was performed by Dr. Mayford Knife and was positive for ischemia.  The patient stopped because of leg weakness.  Cardiac risk factors include hypertension and a history of hyperlipidemia (which was just started to be treated this week).  The patient denies any family history of coronary artery disease, though his father died at the age of 21 of a CVA. his mother is alive without known coronary disease.  He has one sister with no coronary disease.  He is not a previous smoker.  He has no history of peripheral vascular disease or renal insufficiency.  He denies diabetes and denies tobacco use.  PAST MEDICAL HISTORY:  History of narcolepsy and sleep apnea, for which he uses CPAP machine.  He notes marked improvement in his overall status since this.  PAST SURGICAL HISTORY:  Rhinoplasty in 19.  SOCIAL HISTORY:  The patient is  married without children.  He is currently employed as an Insurance underwriter.  MEDICATIONS:  Norvasc 10 mg q.d.  Ritalin p.r.n. sleep apnea narcolepsy.  Just recently started on Altace 2.5 mg q.d.  ALLERGIES:  The patient denies any drug allergies or sensitivities.  REVIEW OF SYSTEMS:  CARDIAC: As above, chest pain and exertionally-related shortness of breath.  The patient specifically denies resting shortness of breath, lower extremity edema, palpitations, syncope, presyncope, or orthopnea.  GENERAL: Overall, the patient had no specific health complaints or problems with the exception of the chest tightness.  RESPIRATORY: He denies any hemoptysis or history of wheezing.  He does use CPAP at night. GASTROINTESTINAL: He denies any complaints.  NEUROLOGIC: He denies any amaurosis or TIAs.  MUSCULOSKELETAL: The patient denies any joint weakness or pain.  GENITOURINARY: He denies any blood in his urine.  He denies any recent infections.  HEMATOLOGIC: He has no history of easy bruisability rule out abnormal bleeding.  ENDOCRINE: The patient denies signs or symptoms of diabetes.  He has no psychiatric history.  He denies claudication.  PHYSICAL EXAMINATION:  GENERAL:  Healthy-appearing male.  Awake, alert and able to relate his history without difficulty.  VITAL SIGNS:  Blood pressure 140/80, pulse 60 and regular, respiratory rate 18, O2 saturations 95%.  HEENT:  Normal with pupils are equal, round and reactive to light.  NECK:  Without bruits or jugular venous distention.  CHEST:  Clear bilaterally with normal S1/S2.  No murmur  or gallop is appreciated.  ABDOMEN:  Benign without palpable masses or organomegaly.  The aorta does not feel palpably enlarged.  EXTREMITIES:  Adequate vein for bypass.  NEUROLOGIC:  Grossly normal.  GENITOURINARY:  Normal male.  RECTAL:  Not done secondary to cardiac status.  LYMPH:  The patient has no palpable supraclavicular or axillary lymph  nodes.  VASCULAR:  DP and PT pulses 2+ bilaterally.  LABORATORY DATA:  Hematocrit 44%.  Creatinine 0.9.  Cardiac catheterization was performed and shows a small right coronary artery with a mid to proximal 80% to 90% lesion.  He has a large, dominant circumflex with a large obtuse marginal without lesions.  There is a small distal PL branch.  The LAD is totally occluded.  There a moderate-sized diagonal with an 80% to 90% lesion.  SUMMARY:  Because of the patients significant three-vessel coronary artery disease and positive stress test, coronary artery bypass grafting was recommended to the patient, who agreed and signed informed consent.  The risks of the procedure were discussed with the patient and his wife in detail including the risk of death, infection, stroke, myocardial infarction, bleeding, and blood transfusion.  The patients and his wifes questions were answered and he is willing to proceed. DD:  01/29/01 TD:  01/31/01 Job: [id number]

## 2011-02-20 NOTE — Op Note (Signed)
NAME:  Aaron Humphrey, Aaron Humphrey NO.:  [id number]   MEDICAL RECORD NO.:  [id number]          PATIENT TYPE:  AMB   LOCATION:  [facility]                         FACILITY:  [facility]   PHYSICIAN:  Thomas A. Cornett, M.D.DATE OF BIRTH:  02-14-1951   DATE OF PROCEDURE:  08/25/2006  DATE OF DISCHARGE:                                 OPERATIVE REPORT   PREOPERATIVE DIAGNOSIS:  Left inguinal hernia.   POSTOPERATIVE DIAGNOSIS:  Left inguinal hernia.   PROCEDURE:  Repair of left inguinal hernia with Prolene hernia system mesh.   SURGEON:  Harriette Bouillon, MD   ANESTHESIA:  LMA with 20 mL of 0.25% Sensorcaine plain.   ESTIMATED BLOOD LOSS:  30 mL.   SPECIMENS:  None.   INDICATIONS FOR PROCEDURE:  The patient is a 60 year old male with  progressively enlarging left inguinal hernia.  This is causing discomfort,  especially with exertion.  He wished to have it repaired.  He was brought in  today to have this done.   DESCRIPTION OF PROCEDURE:  The patient was brought to the operating room,  placed supine.  After induction of general anesthesia, the left inguinal  region was prepped and draped in a sterile fashion.  0.25% Sensorcaine plain  was used for local anesthesia.  This was infiltrated into the skin of his  left inguinal crease.  An oblique incision was used.  Dissection was carried  down through the subcutaneous fat through Scarpa fascia.  The superficial  epigastric vessels were ligated with 2-0 Vicryl ties.  The aponeurosis of  the external oblique was identified and was infiltrated with 0.25%  Sensorcaine.  This was opened in the direction of its fibers.  The cord  structures were identified and encircled with a quarter inch Penrose drain.  There was a cord lipoma that I was able to dissect away from the cord  structures and ligate this.  There was a indirect hernia which I was able to  dissect away from the cord structures as well.  Once I was able to dissect  the hernia away  from the cord structures, I was able to reduce it back into  the preperitoneal space.  I then took my finger and swept around the  preperitoneal space and created a cavity for the Prolene hernia system.  A  large Prolene hernia system was brought onto the field and I placed the  inner leaflet in the preperitoneal space and was able to position it up with  my finger with good coverage of the floor in the inguinal canal.  The onlay  piece of mesh was then laid flat to cover the floor of the inguinal canal.  A small slit was cut for the cord structures.  Of note, the ilioinguinal  nerve was ligated, given its location to the mesh in concern for  postoperative neuralgia.  The mesh was secured to the pubic tubercle with a  0 Vicryl.  0 Novofil was used to secure the onlay piece of mesh to the  shelving edge of the inguinal ligament and to the conjoined tendon medially.  The slit was then closed  around the cord with a 0 Novofil.  The mesh laid  quite nicely with no undue tension and cover the defect with adequate  overlap.  Hemostasis was excellent.  Irrigation was used and suctioned out.  I closed the  aponeurosis of the external oblique with a 2-0 Vicryl.  3-0 Vicryl was used  to reapproximate Scarpa fascia and 4-0 Monocryl was used to close the skin  in a subcuticular fashion.  All final counts of sponge, needle and  instruments were found be correct this portion of the case.  The patient was  awoke, taken to recovery in satisfactory condition.      Thomas A. Cornett, M.D.  Electronically Signed     TAC/MEDQ  D:  08/25/2006  T:  08/25/2006  Job:  [id number]   cc:   Dr. Ciro Backer

## 2011-02-20 NOTE — Op Note (Signed)
NAME:  Aaron Humphrey, Aaron Humphrey NO.:  [id number]   MEDICAL RECORD NO.:  [id number]          PATIENT TYPE:  AMB   LOCATION:  ENDO                         FACILITY:  [facility]   PHYSICIAN:  John C. Madilyn Fireman, M.D.    DATE OF BIRTH:  07-24-1951   DATE OF PROCEDURE:  12/05/2004  DATE OF DISCHARGE:                                 OPERATIVE REPORT   PROCEDURE:  Esophagogastroduodenoscopy with esophageal dilatation.   INDICATIONS FOR PROCEDURE:  Solid food dysphagia with response to esophageal  dilatation in the past.   PROCEDURE:  The patient was placed in the left lateral decubitus position  and placed on the pulse monitor with continuous low flow oxygen delivered by  nasal cannula.  He was sedated with 62.5 mcg IV fentanyl and 5 mg IV Versed.  The Olympus video endoscope was advanced under direct vision into the  oropharynx and esophagus.  The esophagus was straight and of normal caliber  with the squamocolumnar line at 38 cm.  There was no visible hiatal hernia,  ring, stricture, or other abnormalities of the GE junction.  The stomach was  entered, and a small amount of liquid secretions were suctioned from the  fundus.  Retroflexed view of the cardia was unremarkable.  The fundus and  body appeared normal.  The antrum showed some streaks of intense erythema  with some granularity with a slight amount of exudate overlying some of the  streaks, consistent with a moderate antral gastritis.  A CLO test was  obtained.  The pylorus was nondeformed and easily allowed passage of the  endoscope tip into the duodenum.  Both the bulb and second portion were well  inspected and appeared to be within normal limits.  The Savary guidewire was  placed through the endoscope channel, and the endoscope withdrawn.  A single  17 mm Savary dilator was passed over the guidewire with minimal resistance  and no blood seen on withdrawal.  The dilator was removed together with the  wire, and the patient  prepared for colonoscope.  He tolerated the procedure  well, and there were no immediate complications.   IMPRESSION:  Antral gastritis, otherwise normal study, status post empiric  dilatation due to dysphagia.   PLAN:  Await CLO test.  Will advance diet and observe response to  dilatation.      JCH/MEDQ  D:  12/05/2004  T:  12/05/2004  Job:  [id number]   cc:   Meredith Staggers, M.D.  [address]  [place]  [place] [address]  Fax: [phone]

## 2011-09-23 ENCOUNTER — Telehealth: Payer: Self-pay | Admitting: Internal Medicine

## 2011-09-23 DIAGNOSIS — G4733 Obstructive sleep apnea (adult) (pediatric): Secondary | ICD-10-CM

## 2011-09-23 NOTE — Telephone Encounter (Signed)
LMTCB-speak with Katie. 

## 2011-09-24 NOTE — Telephone Encounter (Signed)
Pt returned call. Aaron Humphrey  

## 2011-09-24 NOTE — Telephone Encounter (Signed)
Per CY-okay to order CPAP supplies as requested and patient needs to schedule OV with CY. I have left a message for patient to call back and schedule this appt. Order placed.

## 2011-09-24 NOTE — Telephone Encounter (Signed)
Pt is being seen on 09-28-2011 at 1115 per katie.

## 2011-09-28 ENCOUNTER — Ambulatory Visit (INDEPENDENT_AMBULATORY_CARE_PROVIDER_SITE_OTHER): Payer: BC Managed Care – PPO | Admitting: Internal Medicine

## 2011-09-28 ENCOUNTER — Encounter: Payer: Self-pay | Admitting: Internal Medicine

## 2011-09-28 VITALS — BP 106/80 | HR 50 | Ht 74.0 in | Wt 219.4 lb

## 2011-09-28 DIAGNOSIS — G4710 Hypersomnia, unspecified: Secondary | ICD-10-CM

## 2011-09-28 DIAGNOSIS — G4730 Sleep apnea, unspecified: Secondary | ICD-10-CM

## 2011-09-28 MED ORDER — METHYLPHENIDATE HCL ER (LA) 10 MG PO CP24: ORAL_CAPSULE | ORAL | Status: DC

## 2011-09-28 NOTE — Progress Notes (Signed)
09/28/11- 60 yoM followed for OSA, complicated by HBP, allergic rhinitis, CAD/CABG LOV- 03/11/10 wife is here He continues fully compliant with CPAP at 13 to denies having problems with the machine or mask. Occasional residual daytime sleepiness is addressed with sustained action Ritalin, mainly if he needs to drive or especially tired in the afternoon. He averages one or 2 tablets on most days. This has been well tolerated with no palpitation, chest pain or mood change. Provigil was tried, but expensive.  ROS-see HPI Constitutional:   No-   weight loss, night sweats, fevers, chills, fatigue, lassitude. HEENT:   No-  headaches, difficulty swallowing, tooth/dental problems, sore throat,       No-  sneezing, itching, ear ache, nasal congestion, post nasal drip,  CV:  No-   chest pain, orthopnea, PND, swelling in lower extremities, anasarca, dizziness, palpitations Resp: No-   shortness of breath with exertion or at rest.              No-   productive cough,  No non-productive cough,  No- coughing up of blood.              No-   change in color of mucus.  No- wheezing.   Skin: No-   rash or lesions. GI:  No-   heartburn, indigestion, abdominal pain, nausea, vomiting, diarrhea,                 change in bowel habits, loss of appetite GU: MS:  Neuro-     nothing unusual Psych:  No- change in mood or affect. No depression or anxiety.  No memory loss.  OBJ General- Alert, Oriented, Affect-appropriate, Distress- none acute Skin- rash-none, lesions- none, excoriation- none Lymphadenopathy- none Head- atraumatic            Eyes- Gross vision intact, PERRLA, conjunctivae clear secretions            Ears- Hearing, canals-normal            Nose- Clear, no-Septal dev, mucus, polyps, erosion, perforation             Throat- Mallampati II , mucosa clear , drainage- none, tonsils- atrophic Neck- flexible , trachea midline, no stridor , thyroid nl, carotid no bruit Chest - symmetrical excursion ,  unlabored           Heart/CV- RRR , no murmur , no gallop  , no rub, nl s1 s2                           - JVD- none , edema- none, stasis changes- none, varices- none           Lung- clear to P&A, wheeze- none, cough- none , dullness-none, rub- none           Chest wall-  Abd- Br/ Gen/ Rectal- Not done, not indicated Extrem- cyanosis- none, clubbing, none, atrophy- none, strength- nl Neuro- grossly intact to observation

## 2011-09-28 NOTE — Patient Instructions (Signed)
Order - [facility]- replacement  CPAP mask of choice and supplies. Dx OSA  Refill script for ritalin/ metadate

## 2011-10-03 NOTE — Assessment & Plan Note (Signed)
Appropriate and stable use of Ritalin LA(Metadate) when needed.

## 2011-10-03 NOTE — Assessment & Plan Note (Signed)
Good CPAP compliance and control set at 13, still some residual daytime somnolence. Sleep habits are appropriate. Wife confirms that snoring is prevented.

## 2011-10-06 HISTORY — PX: COLONOSCOPY: SHX174

## 2011-10-07 ENCOUNTER — Encounter (INDEPENDENT_AMBULATORY_CARE_PROVIDER_SITE_OTHER): Payer: Self-pay | Admitting: General Surgery

## 2011-10-07 ENCOUNTER — Telehealth (INDEPENDENT_AMBULATORY_CARE_PROVIDER_SITE_OTHER): Payer: Self-pay | Admitting: General Surgery

## 2011-10-07 NOTE — Telephone Encounter (Signed)
This encounter was created in error - please disregard.

## 2011-10-07 NOTE — Telephone Encounter (Signed)
Patient's wife called s/p repair of RIH by Dr Luisa Hart about 3-4 years ago. He believes he has a recurrence of this hernia. It goes in and out easily but is painful. First available appt with Dr Luisa Hart is 10/29/11. I made appt, but they would really like to see him sooner. Please advise. Call her back on her home number listed below or her cell [phone].

## 2011-10-08 ENCOUNTER — Encounter (INDEPENDENT_AMBULATORY_CARE_PROVIDER_SITE_OTHER): Payer: Self-pay | Admitting: Surgery

## 2011-10-08 ENCOUNTER — Ambulatory Visit (INDEPENDENT_AMBULATORY_CARE_PROVIDER_SITE_OTHER): Payer: BC Managed Care – PPO | Admitting: Surgery

## 2011-10-08 VITALS — BP 136/88 | HR 61 | Temp 98.2°F | Ht 73.0 in | Wt 215.2 lb

## 2011-10-08 DIAGNOSIS — K4090 Unilateral inguinal hernia, without obstruction or gangrene, not specified as recurrent: Secondary | ICD-10-CM

## 2011-10-08 NOTE — Progress Notes (Signed)
Patient ID: Aaron Humphrey, male   DOB: [birth year], 61 y.o.   MRN: [id number]  Chief Complaint  Patient presents with  . Pre-op Exam    recurrent Rt ing hernia    HPI Aaron Humphrey is a 61 y.o. male.  The patient presents to to right groin pain. He had a left inguinal hernia repaired back in 2007 with the Prolene hernia system. 2 weeks ago he developed significant sharp right groin pain and bulge. The bulge slides in and out. The pain is made worse with lifting and standing. The pain is a 5-8/10. No change in bowel or bladder function HPI  Past Medical History  Diagnosis Date  . Unspecified sleep apnea     NPSG 05-14-05-AHI 44/hr  . Unspecified essential hypertension   . Other and unspecified hyperlipidemia   . DM (diabetes mellitus)     broderline  . GERD (gastroesophageal reflux disease)   . Abdominal distension   . Abdominal pain     Past Surgical History  Procedure Date  . Nasal sinus surgery 1978  . Coronary artery bypass graft     4V 2002  . Inquinal hernia repair   . Esophageal dilation     x 3  . Hernia repair     History reviewed. No pertinent family history.  Social History History  Substance Use Topics  . Smoking status: Never Smoker   . Smokeless tobacco: Not on file  . Alcohol Use: No    Allergies  Allergen Reactions  . Ampicillin     Current Outpatient Prescriptions  Medication Sig Dispense Refill  . Ascorbic Acid (VITAMIN C) 1000 MG tablet Take 1,000 mg by mouth daily.        Marland Kitchen aspirin 81 MG tablet Take 81 mg by mouth daily.        Marland Kitchen atenolol (TENORMIN) 25 MG tablet Take 25 mg by mouth daily.        Marland Kitchen atorvastatin (LIPITOR) 40 MG tablet Take 40 mg by mouth daily.        . Cranberry 1000 MG CAPS Take 1 capsule by mouth daily.        . methylphenidate (RITALIN LA) 10 MG 24 hr capsule 1-4 daily as needed  120 capsule  0  . Multiple Vitamin (MULTIVITAMIN) tablet Take 1 tablet by mouth daily.        . Naproxen Sodium (ALEVE) 220 MG CAPS Take  by mouth.        . niacin (NIASPAN) 1000 MG CR tablet Take 2,000 mg by mouth at bedtime.        . vitamin B-12 (CYANOCOBALAMIN) 1000 MCG tablet Take 1,000 mcg by mouth daily.          Review of Systems Review of Systems  Constitutional: Negative for fever, chills and unexpected weight change.  HENT: Negative for hearing loss, congestion, sore throat, trouble swallowing and voice change.   Eyes: Negative for visual disturbance.  Respiratory: Negative for cough and wheezing.   Cardiovascular: Negative for chest pain, palpitations and leg swelling.  Gastrointestinal: Negative for nausea, vomiting, abdominal pain, diarrhea, constipation, blood in stool, abdominal distention, anal bleeding and rectal pain.  Genitourinary: Negative for hematuria and difficulty urinating.  Musculoskeletal: Negative for arthralgias.  Skin: Negative for rash and wound.  Neurological: Negative for seizures, syncope, weakness and headaches.  Hematological: Negative for adenopathy. Does not bruise/bleed easily.  Psychiatric/Behavioral: Negative for confusion.    Blood pressure 136/88, pulse 61, temperature 98.2 F (36.8 C),  temperature source Temporal, height 6\' 1"  (1.854 m), weight 215 lb 3.2 oz (97.614 kg), SpO2 96.00%.  Physical Exam Physical Exam  Constitutional: He is oriented to person, place, and time. He appears well-developed and well-nourished.  HENT:  Head: Normocephalic and atraumatic.  Eyes: EOM are normal. Pupils are equal, round, and reactive to light.  Neck: Normal range of motion.  Cardiovascular: Normal rate and regular rhythm.   Pulmonary/Chest: Effort normal and breath sounds normal.  Abdominal: Soft. Bowel sounds are normal.  Genitourinary:     Musculoskeletal: Normal range of motion.  Neurological: He is alert and oriented to person, place, and time. GCS eye subscore is 4. GCS verbal subscore is 5. GCS motor subscore is 6.  Psychiatric: He has a normal mood and affect. His speech is  normal and behavior is normal. Thought content normal.    Data Reviewed   Assessment    RIH symptomatic reducible    Plan    Repair RIH with mesh.The risk of hernia repair include bleeding,  Infection,   Recurrence of the hernia,  Mesh use, chronic pain,  Organ injury,  Bowel injury,  Bladder injury,   nerve injury with numbness around the incision,  Death,  and worsening of preexisting  medical problems.  The alternatives to surgery have been discussed as well..  Long term expectations of both operative and non operative treatments have been discussed.   The patient agrees to proceed. Open repair recommended over laparoscopic repair due to mesh in preperitoneal space.       Zaryah Seckel A. 10/08/2011, 11:11 AM

## 2011-10-08 NOTE — Telephone Encounter (Signed)
I CONTACTED PT'S WIFE YESTERDAY PM 10-07-11 AND SCHEDULED Aaron Humphrey TO SEE DR. CORNETT THIS AM/GY

## 2011-10-08 NOTE — Patient Instructions (Signed)
Inguinal Hernia, Adult  Care After Refer to this sheet in the next few weeks. These discharge instructions provide you with general information on caring for yourself after you leave the hospital. Your caregiver may also give you specific instructions. Your treatment has been planned according to the most current medical practices available, but unavoidable complications sometimes occur. If you have any problems or questions after discharge, please call your caregiver. HOME CARE INSTRUCTIONS  Put ice on the operative site.   Put ice in a plastic bag.   Place a towel between your skin and the bag.   Leave the ice on for 15 to 20 minutes at a time, 3 to 4 times a day while awake.   Change bandages (dressings) as directed.   Keep the wound dry and clean. The wound may be washed gently with soap and water. Gently blot or dab the wound dry. It is okay to take showers 24 to 48 hours after surgery. Do not take baths, use swimming pools, or use hot tubs for 10 days, or as directed by your caregiver.   Only take over-the-counter or prescription medicines for pain, discomfort, or fever as directed by your caregiver.   Continue your normal diet as directed.   Do not lift anything more than 10 pounds or play contact sports for 3 weeks, or as directed.  SEEK MEDICAL CARE IF:  There is redness, swelling, or increasing pain in the wound.   There is fluid (pus) coming from the wound.   There is drainage from a wound lasting longer than 1 day.   You have an oral temperature above 102 F (38.9 C).   You notice a bad smell coming from the wound or dressing.   The wound breaks open after the stitches (sutures) have been removed.   You notice increasing pain in the shoulders (shoulder strap areas).   You develop dizzy episodes or fainting while standing.   You feel sick to your stomach (nauseous) or throw up (vomit).  SEEK IMMEDIATE MEDICAL CARE IF:  You develop a rash.   You have difficulty  breathing.   You develop a reaction or have side effects to medicines you were given.  MAKE SURE YOU:   Understand these instructions.   Will watch your condition.   Will get help right away if you are not doing well or get worse.  Document Released: 10/22/2006 Document Revised: 06/03/2011 Document Reviewed: 08/21/2009 ExitCare Patient Information 2012 [employer].Inguinal Hernia, Adult Muscles help keep everything in the body in its proper place. But if a weak spot in the muscles develops, something can poke through. That is called a hernia. When this happens in the lower part of the belly (abdomen), it is called an inguinal hernia. (It takes its name from a part of the body in this region called the inguinal canal.) A weak spot in the wall of muscles lets some fat or part of the small intestine bulge through. An inguinal hernia can develop at any age. Men get them more often than women. CAUSES  In adults, an inguinal hernia develops over time.  It can be triggered by:   Suddenly straining the muscles of the lower abdomen.   Lifting heavy objects.   Straining to have a bowel movement. Difficult bowel movements (constipation) can lead to this.   Constant coughing. This may be caused by smoking or lung disease.   Being overweight.   Being pregnant.   Working at a job that requires long   periods of standing or heavy lifting.   Having had an inguinal hernia before.  One type can be an emergency situation. It is called a strangulated inguinal hernia. It develops if part of the small intestine slips through the weak spot and cannot get back into the abdomen. The blood supply can be cut off. If that happens, part of the intestine may die. This situation requires emergency surgery. SYMPTOMS  Often, a small inguinal hernia has no symptoms. It is found when a healthcare provider does a physical exam. Larger hernias usually have symptoms.   In adults, symptoms may include:   A lump in  the groin. This is easier to see when the person is standing. It might disappear when lying down.   In men, a lump in the scrotum.   Pain or burning in the groin. This occurs especially when lifting, straining or coughing.   A dull ache or feeling of pressure in the groin.   Signs of a strangulated hernia can include:   A bulge in the groin that becomes very painful and tender to the touch.   A bulge that turns red or purple.   Fever, nausea and vomiting.   Inability to have a bowel movement or to pass gas.  DIAGNOSIS  To decide if you have an inguinal hernia, a healthcare provider will probably do a physical examination.  This will include asking questions about any symptoms you have noticed.   The healthcare provider might feel the groin area and ask you to cough. If an inguinal hernia is felt, the healthcare provider may try to slide it back into the abdomen.   Usually no other tests are needed.  TREATMENT  Treatments can vary. The size of the hernia makes a difference. Options include:  Watchful waiting. This is often suggested if the hernia is small and you have had no symptoms.   No medical procedure will be done unless symptoms develop.   You will need to watch closely for symptoms. If any occur, contact your healthcare provider right away.   Surgery. This is used if the hernia is larger or you have symptoms.   Open surgery. This is usually an outpatient procedure (you will not stay overnight in a hospital). An cut (incision) is made through the skin in the groin. The hernia is put back inside the abdomen. The weak area in the muscles is then repaired by herniorrhaphy or hernioplasty. Herniorrhaphy: in this type of surgery, the weak muscles are sewn back together. Hernioplasty: a patch or mesh is used to close the weak area in the abdominal wall.   Laparoscopy. In this procedure, a surgeon makes small incisions. A thin tube with a tiny video camera (called a laparoscope)  is put into the abdomen. The surgeon repairs the hernia with mesh by looking with the video camera and using two long instruments.  HOME CARE INSTRUCTIONS   After surgery to repair an inguinal hernia:   You will need to take pain medicine prescribed by your healthcare provider. Follow all directions carefully.   You will need to take care of the wound from the incision.   Your activity will be restricted for awhile. This will probably include no heavy lifting for several weeks. You also should not do anything too active for a few weeks. When you can return to work will depend on the type of job that you have.   During "watchful waiting" periods, you should:   Maintain a healthy weight.     Eat a diet high in fiber (fruits, vegetables and whole grains).   Drink plenty of fluids to avoid constipation. This means drinking enough water and other liquids to keep your urine clear or pale yellow.   Do not lift heavy objects.   Do not stand for long periods of time.   Quit smoking. This should keep you from developing a frequent cough.  SEEK MEDICAL CARE IF:   A bulge develops in your groin area.   You feel pain, a burning sensation or pressure in the groin. This might be worse if you are lifting or straining.   You develop a fever of more than 100.5 F (38.1 C).  SEEK IMMEDIATE MEDICAL CARE IF:   Pain in the groin increases suddenly.   A bulge in the groin gets bigger suddenly and does not go down.   For men, there is sudden pain in the scrotum. Or, the size of the scrotum increases.   A bulge in the groin area becomes red or purple and is painful to touch.   You have nausea or vomiting that does not go away.   You feel your heart beating much faster than normal.   You cannot have a bowel movement or pass gas.   You develop a fever of more than 102.0 F (38.9 C).  Document Released: 02/07/2009 Document Revised: 06/03/2011 Document Reviewed: 02/07/2009 Exit[employer] Patient  Information 2012 [employer]. 

## 2011-10-29 ENCOUNTER — Ambulatory Visit (INDEPENDENT_AMBULATORY_CARE_PROVIDER_SITE_OTHER): Payer: BC Managed Care – PPO | Admitting: Surgery

## 2011-11-02 ENCOUNTER — Telehealth: Payer: Self-pay | Admitting: Internal Medicine

## 2011-11-02 NOTE — Telephone Encounter (Signed)
lmomtcb x 1 for pt.  I called [facility] and they do show that a Pa is already in place for Methylphenidate 10 mg caps and is good through 01/19/12. We will need to speak with the pt to find out exactly what he is needing.

## 2011-11-03 NOTE — Telephone Encounter (Signed)
LMTCBx2. Aaron Humphrey, CMA  

## 2011-11-03 NOTE — Telephone Encounter (Signed)
Pt is going to stay with the dosage he has now and will call if there are nay problems. He said if he decides he wants to try the higher dose he will contact the office .

## 2011-11-03 NOTE — Telephone Encounter (Signed)
Spoke with pt. I advised that we called [facility] and were advised PA not needed at this time and is good through 01/19/12. He verbalized understanding of this.  Pt states that the ritalin 10 mg LA does not come in generic, but the plain ritalin 20 mg does come in generic. Wants new rx for this. Please advise, thanks!

## 2011-11-03 NOTE — Telephone Encounter (Signed)
He has been using sustained action ritalin 10 mg.  He has not had 20 mg ritalin in a while, and it was also the sustained action form.  He can't split these, if that is what he wants, to take 1/2 x 20, because it disrupts the sustained release structure.  If he thinks he needs the stronger 20 mg sustained release, and that it won't be too strong, then we can try it, but may have to try to get a new prior authorization approved, for the new strength.

## 2011-11-03 NOTE — Telephone Encounter (Signed)
Returning call can be reached at [phone].Raylene Everts

## 2011-11-04 DIAGNOSIS — K4090 Unilateral inguinal hernia, without obstruction or gangrene, not specified as recurrent: Secondary | ICD-10-CM

## 2011-11-09 ENCOUNTER — Telehealth: Payer: Self-pay | Admitting: Internal Medicine

## 2011-11-09 NOTE — Telephone Encounter (Signed)
Called and spoke with pt's spouse.  Spouse states pt is currently on the Methylphenidate LA 10mg , which is costing pt too much.  Wife is requesting to switch pt back to Methylphenidate ER 10mg .  # 120 taking 1 to 4 tabs daily prn.  Wife states this will only cost pt $10 from [facility].  CY, please advise if ok or not.  Thanks!. Allergies  Allergen Reactions  . Ampicillin

## 2011-11-09 NOTE — Telephone Encounter (Signed)
Per CY-okay to make this switch from LA to ER 4 daily prn #120.

## 2011-11-10 MED ORDER — METHYLPHENIDATE HCL ER 10 MG PO TBCR: 10.0000 mg | EXTENDED_RELEASE_TABLET | Freq: Four times a day (QID) | ORAL | Status: DC | PRN

## 2011-11-10 NOTE — Telephone Encounter (Signed)
ATC patient to let him know Rx at front for pick up-unable to reach patient and/or leave a message. Will need to try again later.

## 2011-11-10 NOTE — Telephone Encounter (Signed)
I spoke with pt and is aware rx's was upfront for p/u. Nothing further was needed

## 2011-11-10 NOTE — Telephone Encounter (Signed)
Rx printed and placed on CDY's cart to be signed.  LMTCB to see if pt wants this mailed or picked up.

## 2011-12-04 ENCOUNTER — Telehealth (INDEPENDENT_AMBULATORY_CARE_PROVIDER_SITE_OTHER): Payer: Self-pay | Admitting: Surgery

## 2011-12-28 ENCOUNTER — Encounter (INDEPENDENT_AMBULATORY_CARE_PROVIDER_SITE_OTHER): Payer: Self-pay | Admitting: Surgery

## 2012-01-12 ENCOUNTER — Telehealth: Payer: Self-pay | Admitting: Internal Medicine

## 2012-01-12 NOTE — Telephone Encounter (Signed)
I spoke with the pt wife and they have been having issues with getting methyphenidate covered through insurance. She states they called back in feb and asked for rx to be changed from methylphenidate ER to LA 10mg  because they thought this is what would be covered, but now they are stating that this is actually $800 a month, but that methylphenidate ER10mg  will be covered on $10 list at [facility]. So they are requesting med be changed back to methylphenidate ER 10mg  take 1 to 4 tabs daily as needed #120. Please advise if ok to write RX. Pt wife will pick-up when ready. Carron Curie, CMA Allergies  Allergen Reactions  . Ampicillin

## 2012-01-13 MED ORDER — METHYLPHENIDATE HCL ER 10 MG PO TBCR: EXTENDED_RELEASE_TABLET | ORAL | Status: DC

## 2012-01-13 NOTE — Telephone Encounter (Signed)
Ok to  Change script back as requested.

## 2012-01-13 NOTE — Telephone Encounter (Signed)
Spoke with patients wife-aware that RX is at front for pick and is on her way today to get it.

## 2012-01-13 NOTE — Telephone Encounter (Signed)
I have printed off rx and placed on cdy cart for signature

## 2012-01-15 ENCOUNTER — Encounter (INDEPENDENT_AMBULATORY_CARE_PROVIDER_SITE_OTHER): Payer: BC Managed Care – PPO | Admitting: Surgery

## 2012-06-03 ENCOUNTER — Telehealth: Payer: Self-pay | Admitting: Internal Medicine

## 2012-06-03 MED ORDER — METHYLPHENIDATE HCL ER 10 MG PO TBCR: EXTENDED_RELEASE_TABLET | ORAL | Status: DC

## 2012-06-03 NOTE — Telephone Encounter (Signed)
rx printed and will leave this up front once CY has signed the rx.

## 2012-06-03 NOTE — Telephone Encounter (Signed)
Left message that Rx at front for pick up.

## 2012-06-23 ENCOUNTER — Telehealth: Payer: Self-pay | Admitting: Internal Medicine

## 2012-06-23 NOTE — Telephone Encounter (Signed)
Metadate ER 10mg  APPROVED until 06/23/2013. Member # [id number]. Pt and pharmacy have been notified.

## 2012-11-01 ENCOUNTER — Telehealth: Payer: Self-pay | Admitting: Internal Medicine

## 2012-11-01 NOTE — Telephone Encounter (Signed)
Pt is requesting a refill of the methylphenidate er 10 mg and is requesting that this rx be mailed to his home.  CY please advise. Thanks  Last ov--09/28/2011 No pending appts.

## 2012-11-02 MED ORDER — METHYLPHENIDATE HCL ER 10 MG PO TBCR: EXTENDED_RELEASE_TABLET | ORAL | Status: DC

## 2012-11-02 NOTE — Telephone Encounter (Signed)
Per CY-okay to give RX this time but patient needs to make and keep appointment for any more refills.

## 2012-11-02 NOTE — Telephone Encounter (Signed)
Spouse aware and has scheduled the pt for follow-up on 11/25/12 with CY and aware pt must keep appt for further refills. RX printed and placed on CY cart for signature with addressed envelope. Address verified.

## 2012-11-03 NOTE — Telephone Encounter (Signed)
Rx signed and mailed as patient requested.

## 2012-11-25 ENCOUNTER — Ambulatory Visit: Payer: BC Managed Care – PPO | Admitting: Internal Medicine

## 2012-12-30 ENCOUNTER — Ambulatory Visit (INDEPENDENT_AMBULATORY_CARE_PROVIDER_SITE_OTHER): Payer: BC Managed Care – PPO | Admitting: Internal Medicine

## 2012-12-30 ENCOUNTER — Encounter: Payer: Self-pay | Admitting: Internal Medicine

## 2012-12-30 VITALS — BP 122/68 | HR 65 | Ht 72.0 in | Wt 217.6 lb

## 2012-12-30 DIAGNOSIS — G4733 Obstructive sleep apnea (adult) (pediatric): Secondary | ICD-10-CM

## 2012-12-30 DIAGNOSIS — G4710 Hypersomnia, unspecified: Secondary | ICD-10-CM

## 2012-12-30 DIAGNOSIS — G4711 Idiopathic hypersomnia with long sleep time: Secondary | ICD-10-CM

## 2012-12-30 MED ORDER — METHYLPHENIDATE HCL ER 10 MG PO TBCR: EXTENDED_RELEASE_TABLET | ORAL | Status: DC

## 2012-12-30 NOTE — Patient Instructions (Addendum)
We can continue CPAP 13/ [facility]  We can continue Metadate 10 mg- refill script

## 2012-12-30 NOTE — Progress Notes (Signed)
09/28/11- 60 yoM followed for OSA, complicated by HBP, allergic rhinitis, CAD/CABG LOV- 03/11/10 wife is here He continues fully compliant with CPAP at 13 to denies having problems with the machine or mask. Occasional residual daytime sleepiness is addressed with sustained action Ritalin, mainly if he needs to drive or especially tired in the afternoon. He averages one or 2 tablets on most days. This has been well tolerated with no palpitation, chest pain or mood change. Provigil was tried, but expensive.  12/30/12- 61 yoM followed for OSA, complicated by HBP, allergic rhinitis, CAD/CABG FOLLOWS FOR: wears CPAP 13/Advanced every night for about 7 hours; pressure working well for patient. Wife confirms he is not snoring through. Still uses Ritalin, one or 2 tabs daily( Metadate ER 10 mg). He skips Ritalin on weekends but that leaves him very sleepy. He tolerates it well. Legs jerk if he is tired.  ROS-see HPI Constitutional:   No-   weight loss, night sweats, fevers, chills, fatigue, lassitude. HEENT:   No-  headaches, difficulty swallowing, tooth/dental problems, sore throat,       No-  sneezing, itching, ear ache, nasal congestion, post nasal drip,  CV:  No-   chest pain, orthopnea, PND, swelling in lower extremities, anasarca, dizziness, palpitations Resp: No-   shortness of breath with exertion or at rest.              No-   productive cough,  No non-productive cough,  No- coughing up of blood.              No-   change in color of mucus.  No- wheezing.   Skin: No-   rash or lesions. GI:  No-   heartburn, indigestion, abdominal pain, nausea, vomiting,                 change in bowel habits, loss of appetite GU: MS:  Neuro-     nothing unusual Psych:  No- change in mood or affect. No depression or anxiety.  No memory loss.  OBJ General- Alert, Oriented, Affect-appropriate, Distress- none acute. Trim Skin- rash-none, lesions- none, excoriation- none Lymphadenopathy- none Head-  atraumatic            Eyes- Gross vision intact, PERRLA, conjunctivae clear secretions            Ears- Hearing, canals-normal            Nose- Clear, no-Septal dev, mucus, polyps, erosion, perforation             Throat- Mallampati III , mucosa clear , drainage- none, tonsils- atrophic Neck- flexible , trachea midline, no stridor , thyroid nl, carotid no bruit Chest - symmetrical excursion , unlabored           Heart/CV- RRR , no murmur , no gallop  , no rub, nl s1 s2                           - JVD- none , edema- none, stasis changes- none, varices- none           Lung- clear to P&A, wheeze- none, cough- none , dullness-none, rub- none           Chest wall-  Abd- Br/ Gen/ Rectal- Not done, not indicated Extrem- cyanosis- none, clubbing, none, atrophy- none, strength- nl Neuro- grossly intact to observation

## 2013-01-05 NOTE — Assessment & Plan Note (Signed)
His legs jerk sometimes is tired, but that does not sound like cataplexy. I do not think he has narcolepsy but he seems to get adequate nighttime sleep. His best fits "idiopathic hypersomnia". We have had another discussion about stimulant medications, tolerance, diversion, side effects. I do not think he is having a problem with this. Plan-refill Metadate

## 2013-01-05 NOTE — Assessment & Plan Note (Addendum)
Good compliance and control but still needing Ritalin to help with residual daytime sleepiness. Sleep hygiene reviewed.

## 2013-04-25 ENCOUNTER — Telehealth: Payer: Self-pay | Admitting: Internal Medicine

## 2013-04-25 NOTE — Telephone Encounter (Signed)
ATC patients spouse No answer [id number]

## 2013-04-26 MED ORDER — METHYLPHENIDATE HCL ER 10 MG PO TBCR: EXTENDED_RELEASE_TABLET | ORAL | Status: DC

## 2013-04-26 NOTE — Telephone Encounter (Signed)
Last OV on 12/30/12, rov in 1 year, last fill 12/30/12. Rx printed and placed at front. Pt needs to be notified when ready for pick-up. Carron Curie, CMA

## 2013-04-26 NOTE — Telephone Encounter (Signed)
Rx signed by CY and at front for pick up. I left message on number provided that Rx at front. Nothing more needed at this time.

## 2014-01-05 ENCOUNTER — Ambulatory Visit: Payer: BC Managed Care – PPO | Admitting: Internal Medicine

## 2014-01-26 ENCOUNTER — Ambulatory Visit: Payer: BC Managed Care – PPO | Admitting: Internal Medicine

## 2014-03-19 ENCOUNTER — Ambulatory Visit: Payer: BC Managed Care – PPO | Admitting: Internal Medicine

## 2014-03-22 ENCOUNTER — Ambulatory Visit (INDEPENDENT_AMBULATORY_CARE_PROVIDER_SITE_OTHER): Payer: BC Managed Care – PPO | Admitting: Internal Medicine

## 2014-03-22 ENCOUNTER — Encounter: Payer: Self-pay | Admitting: Internal Medicine

## 2014-03-22 VITALS — BP 128/84 | HR 56 | Ht 72.0 in | Wt 205.6 lb

## 2014-03-22 DIAGNOSIS — G4711 Idiopathic hypersomnia with long sleep time: Secondary | ICD-10-CM

## 2014-03-22 DIAGNOSIS — G4733 Obstructive sleep apnea (adult) (pediatric): Secondary | ICD-10-CM

## 2014-03-22 DIAGNOSIS — G4710 Hypersomnia, unspecified: Secondary | ICD-10-CM

## 2014-03-22 MED ORDER — METHYLPHENIDATE HCL ER 10 MG PO TBCR: EXTENDED_RELEASE_TABLET | ORAL | Status: DC

## 2014-03-22 NOTE — Progress Notes (Signed)
09/28/11- 56 yoM followed for OSA, complicated by HBP, allergic rhinitis, CAD/CABG LOV- 03/11/10 wife is here He continues fully compliant with CPAP at 13 to denies having problems with the machine or mask. Occasional residual daytime sleepiness is addressed with sustained action Ritalin, mainly if he needs to drive or especially tired in the afternoon. He averages one or 2 tablets on most days. This has been well tolerated with no palpitation, chest pain or mood change. Provigil was tried, but expensive.  12/30/12- 77 yoM followed for OSA, complicated by HBP, allergic rhinitis, CAD/CABG FOLLOWS FOR: wears CPAP 13/Advanced every night for about 7 hours; pressure working well for patient. Wife confirms he is not snoring through. Still uses Ritalin, one or 2 tabs daily( Metadate ER 10 mg). He skips Ritalin on weekends but that leaves him very sleepy. He tolerates it well. Legs jerk if he is tired.  03/22/14- 23 yoM followed for OSA with hypersomnia, complicated by HBP, allergic rhinitis, CAD/CABG FOLLOWS FOR:  Wearing CPAP 13/Advanced 6-8 hours per night.  Request new supplies, Wife here Difficulty finances. Needs refill metadate ER to drive safely-discussed. 1-3 tabs/ day. No cataplexy. Wife confirms he wears CPAP and does not snore through it.  ROS-see HPI Constitutional:   No-   weight loss, night sweats, fevers, chills, +fatigue, lassitude. HEENT:   No-  headaches, difficulty swallowing, tooth/dental problems, sore throat,       No-  sneezing, itching, ear ache, nasal congestion, post nasal drip,  CV:  No-   chest pain, orthopnea, PND, swelling in lower extremities, anasarca, dizziness, palpitations Resp: No-   shortness of breath with exertion or at rest.              No-   productive cough,  No non-productive cough,  No- coughing up of blood.              No-   change in color of mucus.  No- wheezing.   Skin: No-   rash or lesions. GI:  No-   heartburn, indigestion, abdominal pain, nausea,  vomiting,                 change in bowel habits, loss of appetite GU: MS:  Neuro-     nothing unusual Psych:  No- change in mood or affect. No depression or anxiety.  No memory loss.  OBJ General- Alert, Oriented, Affect-appropriate, Distress- none acute. Trim Skin- rash-none, lesions- none, excoriation- none Lymphadenopathy- none Head- atraumatic            Eyes- Gross vision intact, PERRLA, conjunctivae clear secretions            Ears- Hearing, canals-normal            Nose- Clear, no-Septal dev, mucus, polyps, erosion, perforation             Throat- Mallampati III , mucosa clear , drainage- none, tonsils- atrophic Neck- flexible , trachea midline, no stridor , thyroid nl, carotid no bruit Chest - symmetrical excursion , unlabored           Heart/CV- RRR , no murmur , no gallop  , no rub, nl s1 s2                           - JVD- none , edema- none, stasis changes- none, varices- none           Lung- clear to P&A, wheeze- none, cough- none ,  dullness-none, rub- none           Chest wall-  Abd- Br/ Gen/ Rectal- Not done, not indicated Extrem- cyanosis- none, clubbing, none, atrophy- none, strength- nl Neuro- grossly intact to observation

## 2014-03-22 NOTE — Patient Instructions (Signed)
We can continue CPAP 13 Advanced  Order- download CPAP for pressure compliance  Dx OSA  Refill script printed for metadate ER    See if [facility] can help. See if your pharmacy can suggest a methylphenidate or Adderal  Extended release type product that you could get cheaper.

## 2014-03-25 NOTE — Assessment & Plan Note (Signed)
Good compliance and control with CPAP 13. Residual daytime hypersomnia despite getting enough sleep. He would probably qualify for diagnosis of idiopathic hypersomnia as a second diagnosis. Finances do not permit MSLT at this time. Plan-download CPAP for pressure/compliance, refilled Metadate

## 2014-03-25 NOTE — Assessment & Plan Note (Signed)
He describes more difficulty with daytime sleepiness than would be expected from his hours of sleep and described control and compliance with CPAP

## 2014-04-02 ENCOUNTER — Telehealth: Payer: Self-pay | Admitting: Internal Medicine

## 2014-04-02 MED ORDER — METHYLPHENIDATE HCL ER 10 MG PO TBCR: EXTENDED_RELEASE_TABLET | ORAL | Status: DC

## 2014-04-02 NOTE — Telephone Encounter (Signed)
RX has been printed out for the #60 and i have called the pt and lmom to make them aware.  They will bring the other rx back here and we will change them out.

## 2014-04-02 NOTE — Telephone Encounter (Signed)
Called and spoke with pts wife and she stated that they can get the methylphenidate from [facility], but they  will only dispense #60 per month.  pts wife stated that the pt really only takes this medication when he is driving and this amount would be fine for him.  Requesting to bring back the last rx that was given and pick up the same rx with a quantity of #60.  CY please advise. Thanks  Allergies  Allergen Reactions  . Ampicillin     Current Outpatient Prescriptions on File Prior to Visit  Medication Sig Dispense Refill  . Ascorbic Acid (VITAMIN C) 1000 MG tablet Take 1,000 mg by mouth daily.        Marland Kitchen aspirin 81 MG tablet Take 162 mg by mouth daily.       Marland Kitchen atenolol (TENORMIN) 25 MG tablet Take 25 mg by mouth daily.        Marland Kitchen atorvastatin (LIPITOR) 40 MG tablet Take 40 mg by mouth daily.        . Cranberry 1000 MG CAPS Take 1 capsule by mouth daily.        . methylphenidate 10 MG ER tablet Take 1-4 tablets daily as needed  120 tablet  0  . Multiple Vitamin (MULTIVITAMIN) tablet Take 1 tablet by mouth daily.        . Naproxen Sodium (ALEVE) 220 MG CAPS Take by mouth.        . vitamin B-12 (CYANOCOBALAMIN) 1000 MCG tablet Take 1,000 mcg by mouth daily.         No current facility-administered medications on file prior to visit.

## 2014-04-02 NOTE — Telephone Encounter (Signed)
Ok as requested

## 2014-05-28 ENCOUNTER — Telehealth: Payer: Self-pay | Admitting: Internal Medicine

## 2014-05-28 MED ORDER — METHYLPHENIDATE HCL ER 10 MG PO TBCR: EXTENDED_RELEASE_TABLET | ORAL | Status: DC

## 2014-05-28 NOTE — Telephone Encounter (Signed)
Rx has been signed by CY and patient is aware that Rx is at front for pick up. Nothing more needed at this time.

## 2014-05-28 NOTE — Telephone Encounter (Signed)
Pt is requesting a refill of the methylphenidate 10 mg.  Pt last had this filled on 04/02/14.  CY please advise. Pt would like to pick this up this afternoon.  Thanks  Last ov--03/22/2014 No pending appts.    Allergies  Allergen Reactions  . Ampicillin     Current Outpatient Prescriptions on File Prior to Visit  Medication Sig Dispense Refill  . Ascorbic Acid (VITAMIN C) 1000 MG tablet Take 1,000 mg by mouth daily.        Marland Kitchen aspirin 81 MG tablet Take 162 mg by mouth daily.       Marland Kitchen atenolol (TENORMIN) 25 MG tablet Take 25 mg by mouth daily.        Marland Kitchen atorvastatin (LIPITOR) 40 MG tablet Take 40 mg by mouth daily.        . Cranberry 1000 MG CAPS Take 1 capsule by mouth daily.        . methylphenidate 10 MG ER tablet Take 1-4 tablets daily as needed  60 tablet  0  . Multiple Vitamin (MULTIVITAMIN) tablet Take 1 tablet by mouth daily.        . Naproxen Sodium (ALEVE) 220 MG CAPS Take by mouth.        . vitamin B-12 (CYANOCOBALAMIN) 1000 MCG tablet Take 1,000 mcg by mouth daily.         No current facility-administered medications on file prior to visit.

## 2014-05-28 NOTE — Telephone Encounter (Signed)
Rx printed and placed on CY's cart for signature 

## 2014-05-28 NOTE — Telephone Encounter (Signed)
Ok to refill as before 

## 2014-06-12 ENCOUNTER — Telehealth: Payer: Self-pay | Admitting: Internal Medicine

## 2014-06-12 DIAGNOSIS — G4733 Obstructive sleep apnea (adult) (pediatric): Secondary | ICD-10-CM

## 2014-06-12 NOTE — Telephone Encounter (Signed)
Spoke with patient and wife-they are aware of change in pressure per CY based on most recent download. Pt has agreed to increase pressure.   Pt states he has lost his most recent Rx for Methylphenidate 10 mg and needs another printed-would like to know if CY will reprint and sign. Pt okay with call back tomorrow morning. CY, please advise. Thanks.

## 2014-06-12 NOTE — Telephone Encounter (Signed)
Pt returning call [phone].Aaron Humphrey

## 2014-06-13 MED ORDER — METHYLPHENIDATE HCL ER 10 MG PO TBCR: EXTENDED_RELEASE_TABLET | ORAL | Status: DC

## 2014-06-13 NOTE — Telephone Encounter (Signed)
Wife is calling about rx.  Is it ready?  [phone]

## 2014-06-13 NOTE — Telephone Encounter (Signed)
Katie did CY sign this rx for the pt and is it ready?  Please advise. thanks

## 2014-06-13 NOTE — Telephone Encounter (Signed)
Wife is aware that Rx is ready at front for pick up. Nothing more needed at this time.

## 2014-07-23 ENCOUNTER — Telehealth: Payer: Self-pay | Admitting: Internal Medicine

## 2014-07-23 MED ORDER — METHYLPHENIDATE HCL ER 10 MG PO TBCR: EXTENDED_RELEASE_TABLET | ORAL | Status: DC

## 2014-07-23 NOTE — Telephone Encounter (Signed)
Done

## 2014-07-23 NOTE — Telephone Encounter (Signed)
RX has been signed by CY and placed at front desk for pick up.

## 2014-07-23 NOTE — Telephone Encounter (Signed)
Wife is aware of this.

## 2014-07-23 NOTE — Telephone Encounter (Signed)
Dr. Annamaria Boots, pt is requesting refill on Methylphenidate 10mg  ER.  Last refilled #60 on 06/13/14.  Pt's last ov 02/19/14, no pending appts.  Please advise.  Thank you.   Current Outpatient Prescriptions on File Prior to Visit  Medication Sig Dispense Refill  . Ascorbic Acid (VITAMIN C) 1000 MG tablet Take 1,000 mg by mouth daily.        Marland Kitchen aspirin 81 MG tablet Take 162 mg by mouth daily.       Marland Kitchen atenolol (TENORMIN) 25 MG tablet Take 25 mg by mouth daily.        Marland Kitchen atorvastatin (LIPITOR) 40 MG tablet Take 40 mg by mouth daily.        . Cranberry 1000 MG CAPS Take 1 capsule by mouth daily.        . methylphenidate 10 MG ER tablet Take 1-4 tablets daily as needed  60 tablet  0  . Multiple Vitamin (MULTIVITAMIN) tablet Take 1 tablet by mouth daily.        . Naproxen Sodium (ALEVE) 220 MG CAPS Take by mouth.        . vitamin B-12 (CYANOCOBALAMIN) 1000 MCG tablet Take 1,000 mcg by mouth daily.         No current facility-administered medications on file prior to visit.   Allergies  Allergen Reactions  . Ampicillin

## 2014-07-23 NOTE — Telephone Encounter (Signed)
Katie do you have this? thanks 

## 2014-07-23 NOTE — Telephone Encounter (Signed)
Ok to refill He was to return for 1 year f/u. Last ov was June, 2015.

## 2014-07-23 NOTE — Telephone Encounter (Signed)
RX printed and placed on CDY cart. Please advise once done thanks

## 2014-08-29 ENCOUNTER — Telehealth: Payer: Self-pay | Admitting: Internal Medicine

## 2014-08-29 NOTE — Telephone Encounter (Signed)
Patient requesting written rx for Methylphenidate.  Last refill 07/23/14; last OV: 03/22/14; no OV scheduled.

## 2014-08-29 NOTE — Telephone Encounter (Signed)
Ok to refill 

## 2014-08-31 MED ORDER — METHYLPHENIDATE HCL ER 10 MG PO TBCR: EXTENDED_RELEASE_TABLET | ORAL | Status: DC

## 2014-08-31 NOTE — Telephone Encounter (Signed)
RX has been left for pick up. LMTCB x1 w/ family member

## 2014-08-31 NOTE — Telephone Encounter (Signed)
RX printed and placed on CDY cart for signature. Please advise once done so we can call pt. thanks

## 2014-09-03 NOTE — Telephone Encounter (Signed)
Pt calling back a/b prescript for pick. And I let him know that it was ready for pick-up.Aaron Humphrey

## 2014-09-03 NOTE — Telephone Encounter (Signed)
lmtcb x2 

## 2014-09-14 ENCOUNTER — Telehealth: Payer: Self-pay | Admitting: Internal Medicine

## 2014-09-20 NOTE — Telephone Encounter (Signed)
Will hold message in my basket until patient brings forms to me.

## 2014-09-20 NOTE — Telephone Encounter (Signed)
CY nor myself have received any forms on patient. Has he dropped them off here yet? Thanks.

## 2014-09-20 NOTE — Telephone Encounter (Signed)
Katie, please advise status of form thanks

## 2014-09-20 NOTE — Telephone Encounter (Signed)
Pt is bring form on Monday. FYI thanks

## 2014-09-21 NOTE — Telephone Encounter (Signed)
Pt brought in letter for [employer]. Gave to [employer].

## 2014-09-26 ENCOUNTER — Telehealth: Payer: Self-pay | Admitting: Internal Medicine

## 2014-09-26 MED ORDER — METHYLPHENIDATE HCL ER 10 MG PO TBCR: EXTENDED_RELEASE_TABLET | ORAL | Status: DC

## 2014-09-26 NOTE — Telephone Encounter (Signed)
Pt requesting a refill on methylphenidate.  Last refill 08/31/14.   Are you ok with this refill, Dr. Annamaria Boots?  Thanks!  Allergies  Allergen Reactions  . Ampicillin    Current Outpatient Prescriptions on File Prior to Visit  Medication Sig Dispense Refill  . Ascorbic Acid (VITAMIN C) 1000 MG tablet Take 1,000 mg by mouth daily.      Marland Kitchen aspirin 81 MG tablet Take 162 mg by mouth daily.     Marland Kitchen atenolol (TENORMIN) 25 MG tablet Take 25 mg by mouth daily.      Marland Kitchen atorvastatin (LIPITOR) 40 MG tablet Take 40 mg by mouth daily.      . Cranberry 1000 MG CAPS Take 1 capsule by mouth daily.      . methylphenidate 10 MG ER tablet Take 1-4 tablets daily as needed 60 tablet 0  . Multiple Vitamin (MULTIVITAMIN) tablet Take 1 tablet by mouth daily.      . Naproxen Sodium (ALEVE) 220 MG CAPS Take by mouth.      . vitamin B-12 (CYANOCOBALAMIN) 1000 MCG tablet Take 1,000 mcg by mouth daily.       No current facility-administered medications on file prior to visit.

## 2014-09-26 NOTE — Telephone Encounter (Signed)
Pt up front for pickup, pt aware.  Nothing further needed.

## 2014-09-26 NOTE — Telephone Encounter (Signed)
Forms on CY's cart to complete.

## 2014-09-26 NOTE — Telephone Encounter (Signed)
Forms have been completed and signed bu CY. I have also faxed the forms to [place] at [phone]. Papers placed at front for pick up. Copies made for our records.

## 2014-09-26 NOTE — Telephone Encounter (Signed)
Ok to refill 

## 2014-10-17 ENCOUNTER — Telehealth: Payer: Self-pay | Admitting: Internal Medicine

## 2014-10-17 NOTE — Telephone Encounter (Signed)
Called and spoke with pt and he stated that he needed a PA done for the methyphenidate er 10 mg.  i have called [place] and got the # to call  [phone] PT ID #  [id number]  [place] and this PA has gone into clinical review and they will fax [place] something in the next 48-72 hours to let us know if this has been approved or denied.  Will forward to [place] to follow up with PA.

## 2014-10-23 NOTE — Telephone Encounter (Signed)
[employer] has been approved as of 10-17-14 through 10-18-15.  Left message at home number that Rx has been approved. Also called [employer] and gave the approval information. Nothing more needed at this time.

## 2014-10-23 NOTE — Telephone Encounter (Signed)
Do we know the status of this?

## 2014-10-31 ENCOUNTER — Telehealth: Payer: Self-pay | Admitting: Internal Medicine

## 2014-10-31 NOTE — Telephone Encounter (Signed)
Spoke with pt, states that he dropped off a [employer] form for CY to sign, and that he needs to sign this form before it is faxed to [employer].    Dr Annamaria Boots do you have this form?  Thanks!

## 2014-10-31 NOTE — Telephone Encounter (Signed)
Form was faxed to [place] and has already been sent to [employer] to be scanned into patient's chart.  Called Patient and he is aware that form has been sent to scanning and will await them to send it back to me so he can sign it.  Spoke with Scanning dept in [address] and they will watch for the form and send it to me as soon as they get it.  Awaiting form.

## 2014-11-06 NOTE — Telephone Encounter (Signed)
Pt came by the office and has signed the [employer] forms; confirmed fax went through as well. Copy of forms were made for our records-placed in basket to scan and patient has originals. Nothing more needed at this time.

## 2014-11-06 NOTE — Telephone Encounter (Signed)
Pt returning call.Aaron Humphrey ° °

## 2014-11-06 NOTE — Telephone Encounter (Signed)
Forms are in my to do file-I have left a message for patient to call me back and let me know when a good day and time for him to sign the forms.

## 2014-11-06 NOTE — Telephone Encounter (Signed)
Has this been received? Thanks.

## 2014-11-06 NOTE — Telephone Encounter (Signed)
ATC pt NA WCB 

## 2014-11-06 NOTE — Telephone Encounter (Signed)
Form was given to [facility].Marland Kitchento [facility] for follow up.

## 2014-12-04 ENCOUNTER — Telehealth: Payer: Self-pay | Admitting: Internal Medicine

## 2014-12-04 MED ORDER — METHYLPHENIDATE HCL ER 10 MG PO TBCR: EXTENDED_RELEASE_TABLET | ORAL | Status: DC

## 2014-12-04 NOTE — Telephone Encounter (Signed)
Rx signed by CY and placed up front in brown folder Nothing further needed; will sign off.

## 2014-12-04 NOTE — Telephone Encounter (Signed)
Last OV 03/22/14 No pending OV at this time Last refill 09/26/14 #60  CY - please advise on refill for #120 tablets.

## 2014-12-04 NOTE — Telephone Encounter (Signed)
lmtcb x1 

## 2014-12-04 NOTE — Telephone Encounter (Signed)
Ok to refill as requested. Please make a ROV appointment- we wanted to see him about a year after his last ov appointment

## 2014-12-04 NOTE — Telephone Encounter (Signed)
Pt returned call.  Advised CY okayed the refill for #120 tablets but he needs an appt to be seen Pt okay with this - appt scheduled for 4.11.16 at 3:30pm.  Pt okay with this date and time Pt would like to pick up rx - appt card has been placed in the envelope as well  Rx printed and placed on CY's cart to be signed Will hold to follow up

## 2015-01-14 ENCOUNTER — Ambulatory Visit (INDEPENDENT_AMBULATORY_CARE_PROVIDER_SITE_OTHER): Payer: 59 | Admitting: Internal Medicine

## 2015-01-14 ENCOUNTER — Encounter (INDEPENDENT_AMBULATORY_CARE_PROVIDER_SITE_OTHER): Payer: Self-pay

## 2015-01-14 ENCOUNTER — Encounter: Payer: Self-pay | Admitting: Internal Medicine

## 2015-01-14 VITALS — BP 124/70 | HR 61 | Ht 72.0 in | Wt 204.6 lb

## 2015-01-14 DIAGNOSIS — G4711 Idiopathic hypersomnia with long sleep time: Secondary | ICD-10-CM

## 2015-01-14 DIAGNOSIS — G4733 Obstructive sleep apnea (adult) (pediatric): Secondary | ICD-10-CM

## 2015-01-14 MED ORDER — METHYLPHENIDATE HCL ER 20 MG PO TBCR: EXTENDED_RELEASE_TABLET | ORAL | Status: DC

## 2015-01-14 NOTE — Progress Notes (Signed)
09/28/11- 68 yoM followed for OSA, complicated by HBP, allergic rhinitis, CAD/CABG LOV- 03/11/10 wife is here He continues fully compliant with CPAP at 13 to denies having problems with the machine or mask. Occasional residual daytime sleepiness is addressed with sustained action Ritalin, mainly if he needs to drive or especially tired in the afternoon. He averages one or 2 tablets on most days. This has been well tolerated with no palpitation, chest pain or mood change. Provigil was tried, but expensive.  12/30/12- 19 yoM followed for OSA, complicated by HBP, allergic rhinitis, CAD/CABG FOLLOWS FOR: wears CPAP 13/Advanced every night for about 7 hours; pressure working well for patient. Wife confirms he is not snoring through. Still uses Ritalin, one or 2 tabs daily( Metadate ER 10 mg). He skips Ritalin on weekends but that leaves him very sleepy. He tolerates it well. Legs jerk if he is tired.  03/22/14- 78 yoM followed for OSA with hypersomnia, complicated by HBP, allergic rhinitis, CAD/CABG FOLLOWS FOR:  Wearing CPAP 13/Advanced 6-8 hours per night.  Request new supplies, Wife here Difficulty finances. Needs refill metadate ER to drive safely-discussed. 1-3 tabs/ day. No cataplexy. Wife confirms he wears CPAP and does not snore through it.  01/14/15- 22 yoM followed for OSA with hypersomnia, complicated by HBP, allergic rhinitis, CAD/CABG FOLLOWS FOR: Wears CPAP 13/  every night through [facility]. Wife says he is too sleepy in the daytime "fades away". Ritalin and morning coffee don't seem to make enough difference. He says he just works hard and is tired when he gets home but admits being concerned about his driving on the way home. Denies cataplexy. No active heart symptoms.  ROS-see HPI Constitutional:   No-   weight loss, night sweats, fevers, chills, +fatigue, lassitude. HEENT:   No-  headaches, difficulty swallowing, tooth/dental problems, sore throat,       No-  sneezing, itching, ear ache,  nasal congestion, post nasal drip,  CV:  No-   chest pain, orthopnea, PND, swelling in lower extremities, anasarca, dizziness, palpitations Resp: No-   shortness of breath with exertion or at rest.              No-   productive cough,  No non-productive cough,  No- coughing up of blood.              No-   change in color of mucus.  No- wheezing.   Skin: No-   rash or lesions. GI:  No-   heartburn, indigestion, abdominal pain, nausea, vomiting,                 change in bowel habits, loss of appetite GU: MS:  Neuro-     nothing unusual Psych:  No- change in mood or affect. No depression or anxiety.  No memory loss.  OBJ General- Alert/ laconic, Oriented, Affect-appropriate, Distress- none acute. Trim Skin- rash-none, lesions- none, excoriation- none Lymphadenopathy- none Head- atraumatic            Eyes- Gross vision intact, PERRLA, conjunctivae clear secretions            Ears- Hearing, canals-normal            Nose- Clear, no-Septal dev, mucus, polyps, erosion, perforation             Throat- Mallampati III , mucosa clear , drainage- none, tonsils- atrophic Neck- flexible , trachea midline, no stridor , thyroid nl, carotid no bruit Chest - symmetrical excursion , unlabored  Heart/CV- RRR , no murmur , no gallop  , no rub, nl s1 s2                           - JVD- none , edema- none, stasis changes- none, varices- none           Lung- clear to P&A, wheeze- none, cough- none , dullness-none, rub- none           Chest wall-  Abd- Br/ Gen/ Rectal- Not done, not indicated Extrem- cyanosis- none, clubbing, none, atrophy- none, strength- nl Neuro- grossly intact to observation

## 2015-01-14 NOTE — Patient Instructions (Signed)
We can continue CPAP 10/ Advanced for use any time you are sleeping  Script to try increasing metadate to 20 mg ER. You can use one at a time, up to 4 daily if needed.   Please be carefull driving

## 2015-01-24 NOTE — Assessment & Plan Note (Signed)
Residual sleepiness discussed carefully. Wife may be a little over concerned but he may not be quite concerned enough. Finances difficult. We will wait on MSLT for now. Plan increase Ritalin to 20 mg extended release. Emphasis on naps and good sleep habits.

## 2015-01-24 NOTE — Assessment & Plan Note (Signed)
Compliance and control seem good. We can continue this pressure.

## 2015-03-05 ENCOUNTER — Telehealth: Payer: Self-pay | Admitting: Internal Medicine

## 2015-03-05 MED ORDER — METHYLPHENIDATE HCL ER 20 MG PO TBCR: EXTENDED_RELEASE_TABLET | ORAL | Status: DC

## 2015-03-05 NOTE — Telephone Encounter (Signed)
Last OV 01/14/15 Pending OV 01/20/16 Last refill 01/14/15  CY - please advise if okay to refill. Thanks.

## 2015-03-05 NOTE — Telephone Encounter (Signed)
Ok to refill 

## 2015-03-05 NOTE — Telephone Encounter (Signed)
Rx signed and up front for pick up  I called and spoke with the pt's spouse and notified of this and she verbalized understanding  Nothing further needed

## 2015-04-16 ENCOUNTER — Telehealth: Payer: Self-pay | Admitting: Internal Medicine

## 2015-04-16 NOTE — Telephone Encounter (Signed)
lmtcb x1 

## 2015-04-17 NOTE — Telephone Encounter (Signed)
lmtcb x1 

## 2015-04-18 ENCOUNTER — Telehealth: Payer: Self-pay | Admitting: Internal Medicine

## 2015-04-18 NOTE — Telephone Encounter (Signed)
Spoke with pt and is aware CDY not in this afternoon but may be in tomorrow. Please advise thanks

## 2015-04-18 NOTE — Telephone Encounter (Signed)
Called pt and lmtcb x3--advised if anything further is needed to call back. Will sign off message per triage protocol

## 2015-04-18 NOTE — Telephone Encounter (Signed)
Ok to rewrite, but remind patient that we have to be careful with controlled drug scripts. Feds watch these.

## 2015-04-18 NOTE — Telephone Encounter (Signed)
Spoke with pt. He has missed placed his last prescription that was given to him for his methylphenidate. Pt will need to have this replaced.  Last OV 01/14/15 Pending OV 01/17/16 Last refill - Methylphenidate ER 20mg , 1-4 tablets daily prn, #120  CY - please advise on replacement prescription. Thanks.

## 2015-04-19 MED ORDER — METHYLPHENIDATE HCL ER 20 MG PO TBCR: EXTENDED_RELEASE_TABLET | ORAL | Status: DC

## 2015-04-19 NOTE — Telephone Encounter (Signed)
Rx has been printed. Pt is aware that this is ready for him to pick up. Nothing further was needed.

## 2015-05-04 ENCOUNTER — Emergency Department (HOSPITAL_BASED_OUTPATIENT_CLINIC_OR_DEPARTMENT_OTHER)
Admission: EM | Admit: 2015-05-04 | Discharge: 2015-05-04 | Disposition: A | Discharge status: Home / Self Care | Payer: Worker's Compensation | Attending: Emergency Medicine | Admitting: Emergency Medicine

## 2015-05-04 ENCOUNTER — Encounter (HOSPITAL_BASED_OUTPATIENT_CLINIC_OR_DEPARTMENT_OTHER): Payer: Self-pay

## 2015-05-04 DIAGNOSIS — Z880 Allergy status to penicillin: Secondary | ICD-10-CM | POA: Insufficient documentation

## 2015-05-04 DIAGNOSIS — S61412A Laceration without foreign body of left hand, initial encounter: Secondary | ICD-10-CM | POA: Insufficient documentation

## 2015-05-04 DIAGNOSIS — Z79899 Other long term (current) drug therapy: Secondary | ICD-10-CM | POA: Diagnosis not present

## 2015-05-04 DIAGNOSIS — Y990 Civilian activity done for income or pay: Secondary | ICD-10-CM | POA: Diagnosis not present

## 2015-05-04 DIAGNOSIS — S60512A Abrasion of left hand, initial encounter: Secondary | ICD-10-CM | POA: Insufficient documentation

## 2015-05-04 DIAGNOSIS — Y288XXA Contact with other sharp object, undetermined intent, initial encounter: Secondary | ICD-10-CM | POA: Diagnosis not present

## 2015-05-04 DIAGNOSIS — Y9289 Other specified places as the place of occurrence of the external cause: Secondary | ICD-10-CM | POA: Diagnosis not present

## 2015-05-04 DIAGNOSIS — Z8719 Personal history of other diseases of the digestive system: Secondary | ICD-10-CM | POA: Diagnosis not present

## 2015-05-04 DIAGNOSIS — Z7982 Long term (current) use of aspirin: Secondary | ICD-10-CM | POA: Insufficient documentation

## 2015-05-04 DIAGNOSIS — Z23 Encounter for immunization: Secondary | ICD-10-CM | POA: Insufficient documentation

## 2015-05-04 DIAGNOSIS — I10 Essential (primary) hypertension: Secondary | ICD-10-CM | POA: Diagnosis not present

## 2015-05-04 DIAGNOSIS — E785 Hyperlipidemia, unspecified: Secondary | ICD-10-CM | POA: Diagnosis not present

## 2015-05-04 DIAGNOSIS — Z8669 Personal history of other diseases of the nervous system and sense organs: Secondary | ICD-10-CM | POA: Diagnosis not present

## 2015-05-04 DIAGNOSIS — Y939 Activity, unspecified: Secondary | ICD-10-CM | POA: Insufficient documentation

## 2015-05-04 HISTORY — DX: Narcolepsy without cataplexy: G47.419

## 2015-05-04 MED ORDER — TETANUS-DIPHTH-ACELL PERTUSSIS 5-2.5-18.5 LF-MCG/0.5 IM SUSP
0.5000 mL | Freq: Once | INTRAMUSCULAR | Status: AC
  Administered 2015-05-04: 0.5 mL via INTRAMUSCULAR
  Filled 2015-05-04: qty 0.5

## 2015-05-04 NOTE — Discharge Instructions (Signed)

## 2015-05-04 NOTE — ED Provider Notes (Signed)
CSN: [id number]     Arrival date & time 05/04/15  1851 History   First MD Initiated Contact with Patient 05/04/15 1904     Chief Complaint  Patient presents with  . Extremity Laceration     (Consider location/radiation/quality/duration/timing/severity/associated sxs/prior Treatment) Patient is a 64 y.o. male presenting with hand pain. The history is provided by the patient. No language interpreter was used.  Hand Pain This is a new problem. The problem occurs constantly. The problem has been unchanged. Pertinent negatives include no numbness. Nothing aggravates the symptoms. He has tried nothing for the symptoms. The treatment provided no relief.   Pt reports he cut his hand at work.  Pt reports he needs a tetanus shot.  Past Medical History  Diagnosis Date  . Unspecified sleep apnea     NPSG 05-14-05-AHI 44/hr  . Unspecified essential hypertension   . Other and unspecified hyperlipidemia   . DM (diabetes mellitus)     broderline  . GERD (gastroesophageal reflux disease)   . Abdominal distension   . Abdominal pain   . Narcolepsy    Past Surgical History  Procedure Laterality Date  . Nasal sinus surgery  1978  . Coronary artery bypass graft      4V 2002  . Inquinal hernia repair    . Esophageal dilation      x 3  . Hernia repair     No family history on file. History  Substance Use Topics  . Smoking status: Never Smoker   . Smokeless tobacco: Not on file  . Alcohol Use: No    Review of Systems  Neurological: Negative for numbness.  All other systems reviewed and are negative.     Allergies  Ampicillin  Home Medications   Prior to Admission medications   Medication Sig Start Date End Date Taking? Authorizing Provider  Ascorbic Acid (VITAMIN C) 1000 MG tablet Take 1,000 mg by mouth daily.      Historical Provider, MD  aspirin 81 MG tablet Take 162 mg by mouth daily.     Historical Provider, MD  atenolol (TENORMIN) 25 MG tablet Take 25 mg by mouth daily.       Historical Provider, MD  atorvastatin (LIPITOR) 40 MG tablet Take 40 mg by mouth daily.      Historical Provider, MD  Cranberry 1000 MG CAPS Take 2 capsules by mouth daily.     Historical Provider, MD  methylphenidate (METADATE ER) 20 MG ER tablet Take 1-4 tablets daily as needed 04/19/15 04/18/16  Deneise Lever, MD  Multiple Vitamin (MULTIVITAMIN) tablet Take 1 tablet by mouth daily.      Historical Provider, MD  Naproxen Sodium (ALEVE) 220 MG CAPS Take by mouth.      Historical Provider, MD  vitamin B-12 (CYANOCOBALAMIN) 1000 MCG tablet Take 1,000 mcg by mouth daily.      Historical Provider, MD   BP 140/76 mmHg  Pulse 76  Temp(Src) 98.3 F (36.8 C) (Oral)  Resp 18  Ht 6\' 1"  (1.854 m)  Wt 195 lb (88.451 kg)  BMI 25.73 kg/m2  SpO2 98% Physical Exam  Constitutional: He is oriented to person, place, and time.  Musculoskeletal:  Superficial laceration left hand  8cm no gapping,   abrasion  Neurological: He is alert and oriented to person, place, and time. He has normal reflexes.  Skin: There is erythema.  Psychiatric: He has a normal mood and affect.  Nursing note and vitals reviewed.   ED Course  Procedures (  including critical care time) Labs Review Labs Reviewed - No data to display  Imaging Review No results found.   EKG Interpretation None      MDM   Final diagnoses:  Laceration of left hand, initial encounter    Tetanus Bacitracin dressing    Fransico Meadow, PA-C 05/04/15 1956  Ezequiel Essex, MD 05/05/15 [phone]

## 2015-05-04 NOTE — ED Notes (Signed)
Pt presents with hand laceration, superficial.

## 2015-05-27 ENCOUNTER — Telehealth: Payer: Self-pay | Admitting: Internal Medicine

## 2015-05-27 NOTE — Telephone Encounter (Signed)
Called pt. Went to VM--LMTCB x1

## 2015-05-28 NOTE — Telephone Encounter (Signed)
LMTCB x2  

## 2015-05-29 NOTE — Telephone Encounter (Signed)
lmtcb

## 2015-05-30 ENCOUNTER — Telehealth: Payer: Self-pay | Admitting: Internal Medicine

## 2015-05-30 NOTE — Telephone Encounter (Signed)
Lmtcb. Will close per triage protocol.

## 2015-05-30 NOTE — Telephone Encounter (Signed)
Spoke with pt and informed him once the next refill was rejected we can began the process for a PA. States he just had it filled and doesn't need it for some weeks. Nothing further needed.

## 2015-05-31 DIAGNOSIS — M65311 Trigger thumb, right thumb: Secondary | ICD-10-CM | POA: Diagnosis not present

## 2015-05-31 DIAGNOSIS — M65331 Trigger finger, right middle finger: Secondary | ICD-10-CM | POA: Diagnosis not present

## 2015-06-15 DIAGNOSIS — Z23 Encounter for immunization: Secondary | ICD-10-CM | POA: Diagnosis not present

## 2015-08-01 ENCOUNTER — Ambulatory Visit: Payer: No Typology Code available for payment source | Admitting: Podiatry

## 2015-08-05 DIAGNOSIS — I10 Essential (primary) hypertension: Secondary | ICD-10-CM | POA: Diagnosis not present

## 2015-08-05 DIAGNOSIS — R252 Cramp and spasm: Secondary | ICD-10-CM | POA: Diagnosis not present

## 2015-08-05 DIAGNOSIS — G4733 Obstructive sleep apnea (adult) (pediatric): Secondary | ICD-10-CM | POA: Diagnosis not present

## 2015-08-05 DIAGNOSIS — E782 Mixed hyperlipidemia: Secondary | ICD-10-CM | POA: Diagnosis not present

## 2015-08-05 DIAGNOSIS — I2510 Atherosclerotic heart disease of native coronary artery without angina pectoris: Secondary | ICD-10-CM | POA: Diagnosis not present

## 2015-08-05 DIAGNOSIS — G47419 Narcolepsy without cataplexy: Secondary | ICD-10-CM | POA: Diagnosis not present

## 2015-08-08 ENCOUNTER — Ambulatory Visit: Payer: Medicare Other | Admitting: Podiatry

## 2015-08-13 ENCOUNTER — Telehealth: Payer: Self-pay | Admitting: Internal Medicine

## 2015-08-13 MED ORDER — METHYLPHENIDATE HCL ER 20 MG PO TBCR: EXTENDED_RELEASE_TABLET | ORAL | Status: DC

## 2015-08-13 NOTE — Telephone Encounter (Signed)
Patient calling to get refill on Ritalin.  Patient takes 20mg  ER.  Last OV: 01/14/15; last refilled: 04/19/15; Next OV: 01/17/16  Allergies  Allergen Reactions  . Ampicillin    Current Outpatient Prescriptions on File Prior to Visit  Medication Sig Dispense Refill  . Ascorbic Acid (VITAMIN C) 1000 MG tablet Take 1,000 mg by mouth daily.      Marland Kitchen aspirin 81 MG tablet Take 162 mg by mouth daily.     Marland Kitchen atenolol (TENORMIN) 25 MG tablet Take 25 mg by mouth daily.      Marland Kitchen atorvastatin (LIPITOR) 40 MG tablet Take 40 mg by mouth daily.      . Cranberry 1000 MG CAPS Take 2 capsules by mouth daily.     . methylphenidate (METADATE ER) 20 MG ER tablet Take 1-4 tablets daily as needed 120 tablet 0  . Multiple Vitamin (MULTIVITAMIN) tablet Take 1 tablet by mouth daily.      . Naproxen Sodium (ALEVE) 220 MG CAPS Take by mouth.      . vitamin B-12 (CYANOCOBALAMIN) 1000 MCG tablet Take 1,000 mcg by mouth daily.       No current facility-administered medications on file prior to visit.

## 2015-08-13 NOTE — Telephone Encounter (Signed)
Per Dr. Annamaria Boots, ok to refill Ritalin. Rx printed and signed by Dr. Annamaria Boots and left at front to be picked up. Left detailed message on voicemail advising patient that his prescription is at the front ready to be picked up. Nothing further needed. Closing encounter

## 2015-08-13 NOTE — Telephone Encounter (Signed)
lmtcb for pt.  

## 2015-08-13 NOTE — Telephone Encounter (Signed)
[phone] calling back

## 2015-08-13 NOTE — Telephone Encounter (Signed)
Ok to refill 

## 2015-08-23 ENCOUNTER — Encounter: Payer: Self-pay | Admitting: Cardiology

## 2015-08-23 ENCOUNTER — Ambulatory Visit (INDEPENDENT_AMBULATORY_CARE_PROVIDER_SITE_OTHER): Payer: Medicare Other | Admitting: Cardiology

## 2015-08-23 VITALS — BP 136/84 | HR 54 | Ht 73.0 in | Wt 213.2 lb

## 2015-08-23 DIAGNOSIS — I10 Essential (primary) hypertension: Secondary | ICD-10-CM

## 2015-08-23 DIAGNOSIS — I2510 Atherosclerotic heart disease of native coronary artery without angina pectoris: Secondary | ICD-10-CM | POA: Diagnosis not present

## 2015-08-23 DIAGNOSIS — I4510 Unspecified right bundle-branch block: Secondary | ICD-10-CM | POA: Diagnosis not present

## 2015-08-23 DIAGNOSIS — E785 Hyperlipidemia, unspecified: Secondary | ICD-10-CM | POA: Diagnosis not present

## 2015-08-23 HISTORY — DX: Unspecified right bundle-branch block: I45.10

## 2015-08-23 NOTE — Patient Instructions (Addendum)

## 2015-08-23 NOTE — Progress Notes (Signed)
Cardiology Office Note   Date:  08/23/2015   ID:  [name], DOB 05-Mar-1951, MRN [id number]  PCP:  Gara Kroner, MD    Chief Complaint  Patient presents with  . Coronary Artery Disease  . Hypertension      History of Present Illness: Aaron Humphrey is a 64 y.o. male who presents to establish cardiac care.  He has a history of ASCAD with cath done in 2002 for abnormal stress test.  Cath revealed severe 3 vessel ASCAD with occluded LAD in the mid portion, LCx stenosis and borderline obstructive disease of the RCA.  There was mildly reduced LVF.  He underwent CABG with LIMA to LAD, RIMA to RCA and SVG to diag.  He also has a history of HTN, dyslipidemia and OSA followed by pulmonary.  He has not seen cardiology in some time.  He is doing well.  He denies any chest pain,  LE edema, dizziness, palpitations or syncope.   He has some mild DOE at work because he does not work out at home.    Past Medical History  Diagnosis Date  . Unspecified sleep apnea     NPSG 05-14-05-AHI 44/hr  . Other and unspecified hyperlipidemia   . DM (diabetes mellitus) ([place])     broderline  . GERD (gastroesophageal reflux disease)   . Abdominal distension   . Abdominal pain   . Narcolepsy   . CAD (coronary artery disease), native coronary artery 2002    severe 3 vessel s/p CABG with LIMA to LAD, RIMA to RCA and SVG to diag.  Marland Kitchen Hypertension, essential, benign     Past Surgical History  Procedure Laterality Date  . Nasal sinus surgery  1978  . Coronary artery bypass graft      4V 2002  . Inquinal hernia repair    . Esophageal dilation      x 3  . Hernia repair       Current Outpatient Prescriptions  Medication Sig Dispense Refill  . aspirin 81 MG tablet Take 162 mg by mouth daily.     Marland Kitchen atenolol (TENORMIN) 25 MG tablet Take 25 mg by mouth daily.      Marland Kitchen atorvastatin (LIPITOR) 40 MG tablet Take 40 mg by mouth daily.      . methylphenidate (METADATE ER) 20 MG ER  tablet Take 1-4 tablets daily as needed 120 tablet 0  . Multiple Vitamin (MULTIVITAMIN) tablet Take 1 tablet by mouth daily.      . Naproxen Sodium (ALEVE) 220 MG CAPS Take by mouth.      . vitamin B-12 (CYANOCOBALAMIN) 1000 MCG tablet Take 1,000 mcg by mouth daily.      . Vitamin D, Cholecalciferol, 1000 UNITS TABS Take 1 tablet by mouth daily.     No current facility-administered medications for this visit.    Allergies:   Ampicillin    Social History:  The patient  reports that he has never smoked. He does not have any smokeless tobacco history on file. He reports that he does not drink alcohol or use illicit drugs.   Family History:  The patient's family history is not on file.    ROS:  Please see the history of present illness.   Otherwise, review of systems are positive for none.   All other systems are reviewed and negative.    PHYSICAL EXAM: VS:  BP 136/84 mmHg  Pulse 54  Ht 6\' 1"  (1.854 m)  Wt 96.73 kg (213 lb 4 oz)  BMI 28.14 kg/m2 , BMI Body mass index is 28.14 kg/(m^2). GEN: Well nourished, well developed, in no acute distress HEENT: normal Neck: no JVD, carotid bruits, or masses Cardiac: RRR; no murmurs, rubs, or gallops,no edema  Respiratory:  clear to auscultation bilaterally, normal work of breathing GI: soft, nontender, nondistended, + BS MS: no deformity or atrophy Skin: warm and dry, no rash Neuro:  Strength and sensation are intact Psych: euthymic mood, full affect   EKG:  EKG is ordered today. The ekg ordered today demonstrates sinus bradycardia at 49bpm with RBBB   Recent Labs: No results found for requested labs within last 365 days.    Lipid Panel No results found for: CHOL, TRIG, HDL, CHOLHDL, VLDL, LDLCALC, LDLDIRECT    Wt Readings from Last 3 Encounters:  08/23/15 96.73 kg (213 lb 4 oz)  05/04/15 88.451 kg (195 lb)  01/14/15 92.806 kg (204 lb 9.6 oz)       ASSESSMENT AND PLAN:  1.  ASCAD 3 vessel s/p CABG.  He has no angina and  EKG is nonischemic.  Continue ASA/BB/statin 2.  Dyslipidemia - continue statin.  I will get a copy of his last FLP from his PCP.   3.  HTN - controlled on BB 4.  RBBB - I don't have a copy of his old EKG to compare so I do not know if this is old or new.  I will get a copy of an old EKG from [place].  He is asymptomatic   Current medicines are reviewed at length with the patient today.  The patient does not have concerns regarding medicines.  The following changes have been made:  no change  Labs/ tests ordered today: See above Assessment and Plan  Orders Placed This Encounter  Procedures  . EKG 12-Lead     Disposition:   FU with me in 1 year  Signed, Sueanne Margarita, MD  08/23/2015 3:23 PM    [facility] [place], [place], [place]  [address] Phone: [phone]; Fax: [phone]

## 2015-08-28 DIAGNOSIS — I10 Essential (primary) hypertension: Secondary | ICD-10-CM | POA: Diagnosis not present

## 2015-08-28 DIAGNOSIS — I2510 Atherosclerotic heart disease of native coronary artery without angina pectoris: Secondary | ICD-10-CM | POA: Diagnosis not present

## 2015-08-28 DIAGNOSIS — Z1211 Encounter for screening for malignant neoplasm of colon: Secondary | ICD-10-CM | POA: Diagnosis not present

## 2015-08-28 DIAGNOSIS — G4733 Obstructive sleep apnea (adult) (pediatric): Secondary | ICD-10-CM | POA: Diagnosis not present

## 2015-08-28 DIAGNOSIS — R7309 Other abnormal glucose: Secondary | ICD-10-CM | POA: Diagnosis not present

## 2015-08-28 DIAGNOSIS — Z125 Encounter for screening for malignant neoplasm of prostate: Secondary | ICD-10-CM | POA: Diagnosis not present

## 2015-08-28 DIAGNOSIS — Z1389 Encounter for screening for other disorder: Secondary | ICD-10-CM | POA: Diagnosis not present

## 2015-08-28 DIAGNOSIS — E782 Mixed hyperlipidemia: Secondary | ICD-10-CM | POA: Diagnosis not present

## 2015-08-28 DIAGNOSIS — Z0000 Encounter for general adult medical examination without abnormal findings: Secondary | ICD-10-CM | POA: Diagnosis not present

## 2015-08-28 DIAGNOSIS — G47419 Narcolepsy without cataplexy: Secondary | ICD-10-CM | POA: Diagnosis not present

## 2015-08-28 DIAGNOSIS — R252 Cramp and spasm: Secondary | ICD-10-CM | POA: Diagnosis not present

## 2015-08-28 DIAGNOSIS — Z23 Encounter for immunization: Secondary | ICD-10-CM | POA: Diagnosis not present

## 2015-08-28 LAB — PSA: PSA: 0.44

## 2015-08-28 LAB — TSH: TSH: 1.27 (ref <=5.90)

## 2015-09-04 ENCOUNTER — Ambulatory Visit: Payer: 59 | Admitting: Internal Medicine

## 2015-09-06 DIAGNOSIS — D2272 Melanocytic nevi of left lower limb, including hip: Secondary | ICD-10-CM | POA: Diagnosis not present

## 2015-09-06 DIAGNOSIS — L821 Other seborrheic keratosis: Secondary | ICD-10-CM | POA: Diagnosis not present

## 2015-09-06 DIAGNOSIS — L905 Scar conditions and fibrosis of skin: Secondary | ICD-10-CM | POA: Diagnosis not present

## 2015-09-06 DIAGNOSIS — L812 Freckles: Secondary | ICD-10-CM | POA: Diagnosis not present

## 2015-09-06 DIAGNOSIS — D2261 Melanocytic nevi of right upper limb, including shoulder: Secondary | ICD-10-CM | POA: Diagnosis not present

## 2015-09-06 DIAGNOSIS — D2271 Melanocytic nevi of right lower limb, including hip: Secondary | ICD-10-CM | POA: Diagnosis not present

## 2015-09-06 DIAGNOSIS — D1801 Hemangioma of skin and subcutaneous tissue: Secondary | ICD-10-CM | POA: Diagnosis not present

## 2015-09-06 DIAGNOSIS — D225 Melanocytic nevi of trunk: Secondary | ICD-10-CM | POA: Diagnosis not present

## 2015-09-25 ENCOUNTER — Telehealth: Payer: Self-pay | Admitting: *Deleted

## 2015-09-25 NOTE — Telephone Encounter (Signed)
PA initiated thru [facility]; Key: [id number] Will await response.

## 2015-09-26 NOTE — Telephone Encounter (Signed)
LMTCB x1 for pt.  

## 2015-09-26 NOTE — Telephone Encounter (Signed)
Pt calling to check on status of PA please advise please let pt know when  This has been completed.Hillery Hunter

## 2015-09-26 NOTE — Telephone Encounter (Signed)
Received fax stating medication was denied. Didn't give any alternatives.

## 2015-09-26 NOTE — Telephone Encounter (Signed)
PA still in process The plan will fax you a determination, typically within 1 to 5 business days.

## 2015-09-27 DIAGNOSIS — M65311 Trigger thumb, right thumb: Secondary | ICD-10-CM | POA: Diagnosis not present

## 2015-09-27 DIAGNOSIS — M65331 Trigger finger, right middle finger: Secondary | ICD-10-CM | POA: Diagnosis not present

## 2015-09-27 NOTE — Telephone Encounter (Signed)
---------------------------   Claim Rejection Details  [id number]) - PA REQ BY MD [phone] REQUIRES PRIOR AUTHORIZATION  ---------------------------- Called and spoke with [place] [phone]) to find out why medication was denied.  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  Do not see in pt's chart ADHD diagnosis.   They said that we could call and get this appealed at [phone]  Dr. Annamaria Boots, do you want to Appeal this decision?  Please advise.

## 2015-09-27 NOTE — Telephone Encounter (Signed)
Left Message to make Appointment.  [phone]

## 2015-10-01 NOTE — Telephone Encounter (Signed)
Dr. Annamaria Boots, please advise on below.  Thanks.

## 2015-10-02 ENCOUNTER — Telehealth: Payer: Self-pay | Admitting: Internal Medicine

## 2015-10-02 NOTE — Telephone Encounter (Signed)
Attempted to contact patient, Aaron Humphrey. --------- Dr. Annamaria Boots, please advise on below.  Thanks.   Expand All Collapse All   --------------------------- Claim Rejection Details  [id number]) - PA REQ BY MD [phone] REQUIRES PRIOR AUTHORIZATION  ---------------------------- Called and spoke with [place] [phone]) to find out why medication was denied. 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 Do not see in pt's chart ADHD diagnosis.  They said that we could call and get this appealed at [phone]  Dr. Annamaria Boots, do you want to Appeal this decision? Please advise

## 2015-10-02 NOTE — Telephone Encounter (Signed)
lmtcb X1 for pt to make aware that we are currently trying to appeal his insurance's decision to deny his medication. Called the below number provided for rx appeals, forms are being faxed to our office to fill out for the appeal.  Will hold in triage to look out for forms.

## 2015-10-02 NOTE — Telephone Encounter (Signed)
[phone], pt cb

## 2015-10-02 NOTE — Telephone Encounter (Signed)
Form received for appeal on methylpnenidate.  I've filled out the form to the best of my ability, and handed the paper to [facility] to follow up on as CY is not in the office this afternoon.  Will route to Katie to follow up on.

## 2015-10-02 NOTE — Telephone Encounter (Signed)
Patient Returned call, pt has not heard anything and wants to know the status of this  [phone]

## 2015-10-02 NOTE — Telephone Encounter (Signed)
Please appeal this denial. Methylphenidate is a Ritalin type medication and these are routinely used for sleep disorders like his diagnosed idiopathic hypersomnia. If they can't approve this, please ask if they will approve any ritalin, adderall or provigil type medications.

## 2015-10-02 NOTE — Telephone Encounter (Signed)
Please see previous phone call. It was closed in error.  Pt wants to know the status of PA.  Patient Returned call, pt has not heard anything and wants to know the status of this [phone]

## 2015-10-02 NOTE — Telephone Encounter (Signed)
(  from previously closed telephone encounter) Len Blalock, CMA at 10/02/2015 2:35 PM     Status: Signed       Expand All Collapse All   lmtcb X1 for pt to make aware that we are currently trying to appeal his insurance's decision to deny his medication. Called the below number provided for rx appeals, forms are being faxed to our office to fill out for the appeal. Will hold in triage to look out for forms.       Spoke with pt, pt aware.  Nothing further needed at this time.

## 2015-10-02 NOTE — Telephone Encounter (Signed)
Caryl Pina i working on closed TEE. Will sign off this message

## 2015-10-03 NOTE — Telephone Encounter (Signed)
Forms signed by CY and have been faxed back for appeal process.

## 2015-10-04 ENCOUNTER — Telehealth: Payer: Self-pay | Admitting: Internal Medicine

## 2015-10-04 NOTE — Telephone Encounter (Signed)
Called and spoke with patient. Pt was requesting status of methylpnenidate. I explained to him that the appeal form was faxed on 10/03/15 and we are awaiting a response. He stated he was on the phone with them and would make sure the received the forms. Pt voiced understanding and had no further questions. Nothing further needed at this time.

## 2015-10-22 ENCOUNTER — Telehealth: Payer: Self-pay | Admitting: Internal Medicine

## 2015-10-22 NOTE — Telephone Encounter (Signed)
atc X2, line went to fast busy signal.  Wcb.

## 2015-10-23 NOTE — Telephone Encounter (Signed)
LMTCB

## 2015-10-24 NOTE — Telephone Encounter (Signed)
lmtcb x3 for pt. 

## 2015-10-25 NOTE — Telephone Encounter (Signed)
LM x 4 for pt  Will close this message per triage protocol .

## 2015-10-28 ENCOUNTER — Telehealth: Payer: Self-pay | Admitting: Internal Medicine

## 2015-10-28 NOTE — Telephone Encounter (Signed)
lmtcb x1 for pt. 

## 2015-10-29 NOTE — Telephone Encounter (Signed)
Pt calling with the name with another generic brand for he says is his ritilain for which his insurance will cover which is matavate which is what he spelled for me and is needing a rx for this, please advise.Aaron Humphrey

## 2015-10-30 ENCOUNTER — Telehealth: Payer: Self-pay | Admitting: Internal Medicine

## 2015-10-30 NOTE — Telephone Encounter (Signed)
Pt calling to check on status or rx please advise.Aaron Humphrey

## 2015-10-30 NOTE — Telephone Encounter (Signed)
Ok will await his call back

## 2015-10-30 NOTE — Telephone Encounter (Signed)
Called phone number listed as call back number.  Lady answered the phone and said that patient is at work and will have to call us back when he gets home from work.   Will await call back from patient

## 2015-10-30 NOTE — Telephone Encounter (Signed)
[phone] (H Pt calling back

## 2015-10-30 NOTE — Telephone Encounter (Signed)
lmtcb x1 for pt. 

## 2015-10-31 NOTE — Telephone Encounter (Signed)
Patient Returned call  [phone]

## 2015-10-31 NOTE — Telephone Encounter (Signed)
Pt requesting refill of Metadate 20mg  ER - Take 1-4 tabs PRN Last filled 08/13/15 #120 tabs Upcoming OV with CY 01/2016 Please advise Dr Annamaria Boots. Thanks.     Medication List       This list is accurate as of: 10/30/15 11:59 PM.  Always use your most recent med list.               ALEVE 220 MG Caps  Generic drug:  Naproxen Sodium  Take by mouth.     aspirin 81 MG tablet  Take 162 mg by mouth daily.     atenolol 25 MG tablet  Commonly known as:  TENORMIN  Take 25 mg by mouth daily.     atorvastatin 40 MG tablet  Commonly known as:  LIPITOR  Take 40 mg by mouth daily.     methylphenidate 20 MG ER tablet  Commonly known as:  METADATE ER  Take 1-4 tablets daily as needed     multivitamin tablet  Take 1 tablet by mouth daily.     vitamin B-12 1000 MCG tablet  Commonly known as:  CYANOCOBALAMIN  Take 1,000 mcg by mouth daily.     Vitamin D (Cholecalciferol) 1000 units Tabs  Take 1 tablet by mouth daily.

## 2015-11-04 NOTE — Telephone Encounter (Signed)
Aaron Humphrey- I thought we got this resolved. Please check.

## 2015-11-04 NOTE — Telephone Encounter (Signed)
CY please advise. Left message pt letting know we are still waiting on CY to address.

## 2015-11-04 NOTE — Telephone Encounter (Signed)
Pt cb to follow up [phone]

## 2015-11-05 NOTE — Telephone Encounter (Signed)
Please let patient know that we are working with his insurance company on appeal and what they may cover instead. Aaron Humphrey is contacting his insurance company tomorrow morning and will give updates as she gets them. Thanks.

## 2015-11-05 NOTE — Telephone Encounter (Signed)
LMTCB

## 2015-11-06 NOTE — Telephone Encounter (Signed)
LMTCB

## 2015-11-06 NOTE — Telephone Encounter (Signed)
Called phone number listed in phone message from 09/25/15. Please call. Thanks

## 2015-11-06 NOTE — Telephone Encounter (Signed)
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

## 2015-11-06 NOTE — Telephone Encounter (Signed)
Per [place], she spoke with Holland Falling today and was advised no stimulant will be covered under their plan with the dx of Idiopathic hypersomnia.  This is considered an "off label" dx.  ADHD or narcolepsy will be the only covering dxs.  Dr. Annamaria Boots, please advise.  Thank you.

## 2015-11-06 NOTE — Telephone Encounter (Signed)
ATC Misty at [place] office and was unavailable.  Misty, please advise. Triage can call if needed, please provide the phone number. Thanks.

## 2015-11-08 NOTE — Telephone Encounter (Signed)
Per previous phone message [phone] [facility] # provided and was advised it was a 45 min wait time. WCB

## 2015-11-11 NOTE — Telephone Encounter (Signed)
Called the number listed below, states that since a PA was initiated already, we would have to appeal the decision with the correct dx code.   Appeals department #: [phone], option 2 Called appeals line, was told that the appeal would have to be done via fax, and that the forms can be downloaded via [email or web address] Forms printed, filled out to the best of my ability, and placed on Aaron Humphrey's desk for further completion.  Will route to Aaron Humphrey to follow up on

## 2015-11-12 NOTE — Telephone Encounter (Signed)
Spoke with pt. Advised him that we sent in the appeal to [facility]. Will await appeal response.

## 2015-11-12 NOTE — Telephone Encounter (Signed)
"  Request for redetermination of an [facility] Prescription drug denial" form was placed in CY's cart to sign; forms were returned to me today and have been faxed back to [place] at [phone]. Will await an approval/denial from them.

## 2015-11-12 NOTE — Telephone Encounter (Signed)
Pt calling back [phone]

## 2015-11-12 NOTE — Telephone Encounter (Signed)
Pt returning call.Aaron Humphrey ° °

## 2015-11-12 NOTE — Telephone Encounter (Signed)
lmtcb X1 for pt  

## 2015-11-14 ENCOUNTER — Telehealth: Payer: Self-pay | Admitting: Internal Medicine

## 2015-11-14 NOTE — Telephone Encounter (Signed)
Called [employer] and this is still being determined. Will await response.

## 2015-11-14 NOTE — Telephone Encounter (Signed)
Spoke with Rob at [employer]. Pt's diagnosis code has been given to him. He will keep [place] update on pt's medication status. Nothing further was needed at this time.

## 2015-11-14 NOTE — Telephone Encounter (Signed)
lmtcb x1 for Rob.

## 2015-11-18 NOTE — Telephone Encounter (Signed)
Called Rob at [place] to request appeal response. Left message for him to call back with information.

## 2015-11-18 NOTE — Telephone Encounter (Signed)
Patient calling to get status of PA appeal. Advised patient that I have a call in to [place] at [place] and I will call him back as soon as I hear back from [place]. Hold in triage until complete.

## 2015-11-18 NOTE — Telephone Encounter (Signed)
[phone] [place] calling back

## 2015-11-29 ENCOUNTER — Telehealth: Payer: Self-pay | Admitting: Internal Medicine

## 2015-11-29 NOTE — Telephone Encounter (Signed)
Spoke with pt, states that he had spoken with a company called [employer] about his methylphenidate denial-was told that our office has not submitted anything regarding this new denial.  I advised that the last documentation on his appeal was from 11/14/15 when we spoke to Rob from [place] (see previous phone message).  Pt states this is a new appeal, and we need to initiate this through [place].  The number to contact [employer] [phone].  Will need Medicare # to get through to speak to anyone.    Will hold message to initiate appeal on Monday.

## 2015-12-03 NOTE — Telephone Encounter (Signed)
Checking the status of this.  Pt request [place] not to call him,  He will be unable to reach him. He will call back to check on this.

## 2015-12-03 NOTE — Telephone Encounter (Signed)
Called below number listed for [employer], was told that we needed to initiate appeal via paper- this form is found on [email or web address] for reconsideration of medicare prescription drug denial.  Form printed and filled out to the best of my ability.  Form placed on CY's desk for further completion.  Will forward message to [place] to follow up on.

## 2015-12-05 NOTE — Telephone Encounter (Signed)
Spoke with the pt  He states that he is calling to check the status of appeal  I advised him that we have the form and waiting for CDY to complete and sign it  He verbalized understanding and nothing further needed  Will forward to Katie to f/u on

## 2015-12-05 NOTE — Telephone Encounter (Signed)
Pt calling back to check on status of this once again.Aaron Humphrey

## 2015-12-06 ENCOUNTER — Encounter: Payer: Self-pay | Admitting: Internal Medicine

## 2015-12-10 NOTE — Telephone Encounter (Signed)
Joellen Jersey- nobody in triage has received this letter from you.  Please advise.  Thanks.

## 2015-12-10 NOTE — Telephone Encounter (Signed)
Letter had been given to Crystal, who has now given it to triage.  Letter has been faxed.  Original form placed on katie's desk for follow up.  Will forward message back to [place] to follow up on.

## 2015-12-10 NOTE — Telephone Encounter (Signed)
CY has completed the letter and returned to Triage to fax back.

## 2015-12-10 NOTE — Telephone Encounter (Signed)
Dr Annamaria Boots, have you signed his appeal letter? He is calling again to check on this. Please advise, thanks!

## 2015-12-10 NOTE — Telephone Encounter (Signed)
Pt called back checking on status of appeal. Please advise thanks

## 2015-12-10 NOTE — Telephone Encounter (Signed)
Aaron Humphrey - has this been done? Please advise.

## 2015-12-11 NOTE — Telephone Encounter (Signed)
LVM for pt to return call

## 2015-12-11 NOTE — Telephone Encounter (Signed)
[phone], pt cb to check status of appeal

## 2015-12-12 NOTE — Telephone Encounter (Signed)
ATC pt's cell # - went directly to VM.  [email or web address]  ATC pt's home # - [email or web address]

## 2015-12-12 NOTE — Telephone Encounter (Signed)
Spoke with the pt and notified that the letter was faxed  He verbalized understanding  I advised that we will contact him when we get approval/denial

## 2015-12-17 NOTE — Telephone Encounter (Signed)
Aaron Humphrey - have you received any information regarding this appeal? Attempted to contact patient back, left message for him to call back.

## 2015-12-17 NOTE — Telephone Encounter (Signed)
Pt calling stating that about this again stating that the people are stating that they haven't received fax on his claim and something about they couldn't find him because his medicare # didn;t have the T on the end of his card # please advise.Hillery Hunter

## 2015-12-17 NOTE — Telephone Encounter (Signed)
I have not received any updates on patient nor do I have anything on my desk regarding patient. Thanks .

## 2015-12-18 NOTE — Telephone Encounter (Signed)
Spoke with the pt  He states spoke with [facility] 3/14 and was advised that they had not received anything from [place]  I advised that we did fax appeal forms and letter to them on 12/10/15  I went ahead and faxed it again  I have let the pt know this  I have called [facility] at [phone]  Spoke with Giovani  He states that nothing is showing yet for pt- but that does not mean that it has not been received, "we receive 100,000 of these per wk and it takes time to process them" He states that we should call back on Friday and check again  [facility] for the pt to let him know this

## 2015-12-19 NOTE — Telephone Encounter (Signed)
LM for pt x 2 

## 2015-12-20 NOTE — Telephone Encounter (Signed)
Called [facility] [phone] and spoke with [facility]. She states she has not received any appeal information and is asking that it be faxed again. I faxed forms again.  Will await response

## 2015-12-23 NOTE — Telephone Encounter (Signed)
Pt calling checking on status on the appeal.

## 2015-12-23 NOTE — Telephone Encounter (Signed)
I called [facility] AGAIN- on hold for 10 min  LMTCB for the pt to make him aware

## 2015-12-25 NOTE — Telephone Encounter (Signed)
Called [facility]- on hold 8 min and was d/c'ed Called back again and spoke with Altha Harm  She reports that the appeal was received and is showing as pending as of 12/20/15  It takes AT LEAST 7-10 days for decision  I spoke with the pt and notified of this  Will forward back to KW to keep and eye out for approval/denial

## 2015-12-27 NOTE — Telephone Encounter (Signed)
Spoke with pt   He states that he called [facility] for status update today, and they advised that the appeal was denied  Will forward to CDY to see if he can advise on alternative med for Methylphenidate  Please advise thanks!  Allergies  Allergen Reactions  . Ampicillin    Current Outpatient Prescriptions on File Prior to Visit  Medication Sig Dispense Refill  . aspirin 81 MG tablet Take 162 mg by mouth daily.     Marland Kitchen atenolol (TENORMIN) 25 MG tablet Take 25 mg by mouth daily.      Marland Kitchen atorvastatin (LIPITOR) 40 MG tablet Take 40 mg by mouth daily.      . methylphenidate (METADATE ER) 20 MG ER tablet Take 1-4 tablets daily as needed 120 tablet 0  . Multiple Vitamin (MULTIVITAMIN) tablet Take 1 tablet by mouth daily.      . Naproxen Sodium (ALEVE) 220 MG CAPS Take by mouth.      . vitamin B-12 (CYANOCOBALAMIN) 1000 MCG tablet Take 1,000 mcg by mouth daily.      . Vitamin D, Cholecalciferol, 1000 UNITS TABS Take 1 tablet by mouth daily.     No current facility-administered medications on file prior to visit.

## 2015-12-30 ENCOUNTER — Encounter: Payer: Self-pay | Admitting: Internal Medicine

## 2015-12-30 DIAGNOSIS — G4730 Sleep apnea, unspecified: Secondary | ICD-10-CM

## 2015-12-30 DIAGNOSIS — G4710 Hypersomnia, unspecified: Secondary | ICD-10-CM | POA: Insufficient documentation

## 2015-12-30 NOTE — Telephone Encounter (Signed)
Order ritalin 20 mg ER # 30, 1 daily   For dx hypersomnia with OSA    ICD10 G47.10. G47.30

## 2015-12-30 NOTE — Telephone Encounter (Signed)
lmtcb X1 for pt  

## 2015-12-31 MED ORDER — METHYLPHENIDATE HCL ER (LA) 20 MG PO CP24: 20.0000 mg | ORAL_CAPSULE | ORAL | Status: DC

## 2015-12-31 NOTE — Telephone Encounter (Signed)
Pt is calling back he will call back

## 2015-12-31 NOTE — Telephone Encounter (Signed)
Pt returned call. Informed him of the recs per CY to change to Ritalin. Pt verbalized understanding and requested to pick up rx. Rx printed and signed by CY and placed up front for pick up. Pt verbalized understanding and denied any further questions or concerns at this time.

## 2015-12-31 NOTE — Telephone Encounter (Signed)
LVM for pt to return call

## 2016-01-01 ENCOUNTER — Telehealth: Payer: Self-pay | Admitting: Internal Medicine

## 2016-01-01 NOTE — Telephone Encounter (Signed)
Spoke with pt's wife and she is going to call husband and ask which pharmacy he is using.

## 2016-01-02 NOTE — Telephone Encounter (Signed)
lmtcb x1 for pt. 

## 2016-01-17 ENCOUNTER — Ambulatory Visit: Payer: 59 | Admitting: Internal Medicine

## 2016-01-27 DIAGNOSIS — M65331 Trigger finger, right middle finger: Secondary | ICD-10-CM | POA: Diagnosis not present

## 2016-01-27 DIAGNOSIS — M65321 Trigger finger, right index finger: Secondary | ICD-10-CM | POA: Diagnosis not present

## 2016-02-03 DIAGNOSIS — M79675 Pain in left toe(s): Secondary | ICD-10-CM | POA: Diagnosis not present

## 2016-02-03 DIAGNOSIS — L03032 Cellulitis of left toe: Secondary | ICD-10-CM | POA: Diagnosis not present

## 2016-02-03 DIAGNOSIS — M79674 Pain in right toe(s): Secondary | ICD-10-CM | POA: Diagnosis not present

## 2016-02-07 ENCOUNTER — Telehealth: Payer: Self-pay | Admitting: Internal Medicine

## 2016-02-07 NOTE — Telephone Encounter (Signed)
Left message for patient to call back  

## 2016-02-10 MED ORDER — METHYLPHENIDATE HCL ER 20 MG PO TBCR: EXTENDED_RELEASE_TABLET | ORAL | Status: DC

## 2016-02-10 NOTE — Telephone Encounter (Signed)
Patient calling to get refill on Metadate 20mg  1-4 tablets daily.   Last refilled: 08/13/2015 Last OV: 01/14/2015 No OV scheduled.  Patient states that he never picked up the Ritalin medication, he only uses the Metadate.   Current Outpatient Prescriptions on File Prior to Visit  Medication Sig Dispense Refill  . aspirin 81 MG tablet Take 162 mg by mouth daily.     Marland Kitchen atenolol (TENORMIN) 25 MG tablet Take 25 mg by mouth daily.      Marland Kitchen atorvastatin (LIPITOR) 40 MG tablet Take 40 mg by mouth daily.      . methylphenidate (METADATE ER) 20 MG ER tablet Take 1-4 tablets daily as needed 120 tablet 0  . methylphenidate (RITALIN LA) 20 MG 24 hr capsule Take 1 capsule (20 mg total) by mouth every morning. 30 capsule 0  . Multiple Vitamin (MULTIVITAMIN) tablet Take 1 tablet by mouth daily.      . Naproxen Sodium (ALEVE) 220 MG CAPS Take by mouth.      . vitamin B-12 (CYANOCOBALAMIN) 1000 MCG tablet Take 1,000 mcg by mouth daily.      . Vitamin D, Cholecalciferol, 1000 UNITS TABS Take 1 tablet by mouth daily.     No current facility-administered medications on file prior to visit.   Allergies  Allergen Reactions  . Ampicillin

## 2016-02-10 NOTE — Telephone Encounter (Signed)
Ok to refill [employer] as requested

## 2016-02-10 NOTE — Telephone Encounter (Signed)
Called spoke with pt. Informed him that rx was approved and is ready for pick up. He states he will come in tomorrow for the prescription. He voiced understanding and had no further questions. Rx placed at the front of the office. Nothing further needed.

## 2016-02-10 NOTE — Telephone Encounter (Signed)
Left message for patient to call back  

## 2016-03-11 ENCOUNTER — Telehealth: Payer: Self-pay | Admitting: Internal Medicine

## 2016-03-11 NOTE — Telephone Encounter (Signed)
lmtcb x1 for pt. 

## 2016-03-12 NOTE — Telephone Encounter (Signed)
Spoke with pt, states he cannot take capsules and needed a tablet of his medication- this was handled when he was in office yesterday.  Nothing further needed.

## 2016-03-24 ENCOUNTER — Telehealth: Payer: Self-pay | Admitting: Internal Medicine

## 2016-03-25 NOTE — Telephone Encounter (Signed)
Form was completed and given to Katie to fax.

## 2016-03-25 NOTE — Telephone Encounter (Signed)
Forwarding to KW to follow up on.  

## 2016-03-26 NOTE — Telephone Encounter (Signed)
Left detailed message on voicemail advising patient that forms have been completed, faxed and mailed back to him. Nothing further needed.

## 2016-03-26 NOTE — Telephone Encounter (Signed)
Forms have been faxed back to [employer]; mailed originals to patient at home address. Copies made for our records.

## 2016-03-31 ENCOUNTER — Telehealth: Payer: Self-pay | Admitting: Internal Medicine

## 2016-03-31 NOTE — Telephone Encounter (Signed)
lmcb x1 for pt. We need to know if the pt wants to pick up or have this prescription mailed to him.

## 2016-03-31 NOTE — Telephone Encounter (Signed)
DC his two methylphenidate scripts.  Offer Adderall XR 20 mg, # 30, 1 daily  And           Adderall IR   20 mg   # 120    1-4 daily as needed

## 2016-03-31 NOTE — Telephone Encounter (Signed)
Called pt. A gentleman answered the phone then line went silent and he hung up. Tried call pt back and LVM for pt to return call.

## 2016-03-31 NOTE — Telephone Encounter (Signed)
Called spoke with pt. He states that [employer] no longer covers Methylphenidate. He states he contacted [place] and they stated that they will cover adderall. Pt explained that he would like a message sent to CY to see if he could have his prescription changed to adderall. I informed him that I would need to send a message to [facility] for approval but that I would return his call with CY's recs. He requested we call his home number at [phone] and it was okay to leave a detailed message. Pt voiced understanding and had no further questions.  CY please advise  Allergies  Allergen Reactions  . Ampicillin     Current outpatient prescriptions:  .  aspirin 81 MG tablet, Take 162 mg by mouth daily. , Disp: , Rfl:  .  atenolol (TENORMIN) 25 MG tablet, Take 25 mg by mouth daily.  , Disp: , Rfl:  .  atorvastatin (LIPITOR) 40 MG tablet, Take 40 mg by mouth daily.  , Disp: , Rfl:  .  methylphenidate (METADATE ER) 20 MG ER tablet, Take 1-4 tablets daily as needed, Disp: 120 tablet, Rfl: 0 .  methylphenidate (RITALIN LA) 20 MG 24 hr capsule, Take 1 capsule (20 mg total) by mouth every morning., Disp: 30 capsule, Rfl: 0 .  Multiple Vitamin (MULTIVITAMIN) tablet, Take 1 tablet by mouth daily.  , Disp: , Rfl:  .  Naproxen Sodium (ALEVE) 220 MG CAPS, Take by mouth.  , Disp: , Rfl:  .  vitamin B-12 (CYANOCOBALAMIN) 1000 MCG tablet, Take 1,000 mcg by mouth daily.  , Disp: , Rfl:  .  Vitamin D, Cholecalciferol, 1000 UNITS TABS, Take 1 tablet by mouth daily., Disp: , Rfl:

## 2016-03-31 NOTE — Telephone Encounter (Signed)
Patient returning call-prm  °

## 2016-04-01 MED ORDER — AMPHETAMINE-DEXTROAMPHETAMINE 20 MG PO TABS: 20.0000 mg | ORAL_TABLET | Freq: Every day | ORAL | Status: DC

## 2016-04-01 MED ORDER — AMPHETAMINE-DEXTROAMPHET ER 20 MG PO CP24: 20.0000 mg | ORAL_CAPSULE | Freq: Every day | ORAL | Status: DC

## 2016-04-01 NOTE — Telephone Encounter (Signed)
Spoke with pt - requests to pick up new rx's.  Rx's printed and placed on CY cart to sign.  Will need to be placed up front to be picked up later by the patient.  Nothing further needed.

## 2016-04-01 NOTE — Telephone Encounter (Signed)
lmtcb x1 for pt. 

## 2016-04-01 NOTE — Telephone Encounter (Signed)
[phone] he wants to discuss first

## 2016-04-03 ENCOUNTER — Telehealth: Payer: Self-pay | Admitting: Internal Medicine

## 2016-04-03 NOTE — Telephone Encounter (Signed)
Spoke with pt and his wife regarding the new Adderall rx's. Pt was unsure which one he needs to take and how different they may be from his previous medication. Reviewed medication information and explained difference between XR and IR. Pt would like to try IR version. Advised pt to start at lowest dosing and increase as needed/tolerated. Also advised not to take too late in the day as may cause excessive alertness and insomnia. Pt and wife voiced understanding. Nothing further needed.

## 2016-04-06 ENCOUNTER — Telehealth: Payer: Self-pay | Admitting: Internal Medicine

## 2016-04-06 DIAGNOSIS — G4733 Obstructive sleep apnea (adult) (pediatric): Secondary | ICD-10-CM

## 2016-04-06 NOTE — Telephone Encounter (Signed)
pts wife is calling again about his adderall. Says he is out of his meds and has to have them by tomorrow.

## 2016-04-06 NOTE — Telephone Encounter (Signed)
lmtcb for pt.  

## 2016-04-06 NOTE — Telephone Encounter (Signed)
Spoke with pt's wife, advised again that we are trying to get another PA for adderall covered.  Pt's wife expressed understanding. Called pharmacy, PA form being refaxed to office.  Will await fax to initiate PA.

## 2016-04-06 NOTE — Telephone Encounter (Signed)
Called spoke with pt. He reports his adderall needs PA.  Called [facility]. Was advised they are going to fax over the PA form. Will await fax

## 2016-04-06 NOTE — Telephone Encounter (Signed)
Pt calling back [phone]

## 2016-04-06 NOTE — Telephone Encounter (Signed)
LMTCB

## 2016-04-06 NOTE — Telephone Encounter (Signed)
Pt returning call

## 2016-04-08 ENCOUNTER — Other Ambulatory Visit: Payer: Self-pay | Admitting: Internal Medicine

## 2016-04-08 DIAGNOSIS — G4733 Obstructive sleep apnea (adult) (pediatric): Secondary | ICD-10-CM

## 2016-04-08 NOTE — Telephone Encounter (Signed)
To try to demonstrate a separate diagnosis of Narcolepsy, we would have to order:  CPAP sleep study on his home CPAP pressure Followed the next morning by a Multiple Sleep Latency Test (MSLT), which would keep him at the sleep center for the night of the CPAP test and then all day until 6:00 PM next day for the MSLT. He would be given breakfast and lunch for the MSLT. He would need to be off all stimulant and sedative medications for three days before these tests.  The purpose is to show that even with CPAP controlling sleep apnea, he is still too sleepy.  He used to be able to get methylphenidate/ Ritalin. Would he rather go back to that?

## 2016-04-08 NOTE — Telephone Encounter (Signed)
Called and spoke with pt. He is wanting [place] to contact his insurance company again about this denial. I called [facility] and spoke with [place]. Adderall will not be covered under the documented diagnosis of Hypersomnia. Spoke with a pharmacist and they stated the only medication that would possibly be covered is Modafinil. We would have to do a PA on this. There is no supporting documentation that the pt has Narcolepsy.  CY - please advise. Thanks.

## 2016-04-08 NOTE — Telephone Encounter (Signed)
States he want to speak to nurse about getting Dextroamphetamine instead of PA for adderrall [phone] or  [phone]

## 2016-04-08 NOTE — Telephone Encounter (Signed)
Narcolepsy is on patient's problem list-okay to proceed with that as Dx. Thanks

## 2016-04-08 NOTE — Telephone Encounter (Signed)
Please check PA folder in the triage room for any updated information. Thanks.

## 2016-04-08 NOTE — Telephone Encounter (Signed)
Denial letter and appeal form are in CY's folder at front desk. Per [facility] they will only cover this medication for narcolepsy and this is not listed as a diagnosis for this pt.  Routing to [place] for follow up as CY will have to advise on next step.

## 2016-04-08 NOTE — Telephone Encounter (Signed)
Spoke with pt's wife. She will have pt call back to go over these recommendations.

## 2016-04-08 NOTE — Telephone Encounter (Signed)
LVM for pt to return call

## 2016-04-08 NOTE — Telephone Encounter (Signed)
Pt calling about PA, called to give [place] his pt ID # which is [id number]  The phone # to call is [phone] opt 1 and if you need to speak to him call [phone].Hillery Hunter

## 2016-04-08 NOTE — Telephone Encounter (Signed)
Can you please specify where Narcolepsy is documented? This is not on pt's problem list. Thanks.

## 2016-04-08 NOTE — Telephone Encounter (Signed)
Spoke with pt and gave CY's recommendations. He would like to have testing ordered. Advised pt that if he or his wife can find out if there is anything else covered on pt's formulary that would be acceptable replacement for Ritalin and Adderall to please call and let us know. Order for testing placed. Pt advised that they will call him to schedule. Nothing further needed at this time.

## 2016-04-08 NOTE — Telephone Encounter (Signed)
Pt called into the office stating that his wife started a PA on his adderalll. He states that they need further information from our office.  He informed me that the number is [phone] and his member ID is [id number].  I explained to him that I would contact the his insurance company to discuss the PA. He voiced understanding and had no further questions.   Called spoke with Sharyn Lull at [facility]. Reviewed the PA information and the PA was denied due to diagnosis. She states that she will fax the appeal for to [phone].  Will forward message to [facility] for follow up.

## 2016-05-13 ENCOUNTER — Telehealth: Payer: Self-pay | Admitting: Internal Medicine

## 2016-05-13 NOTE — Telephone Encounter (Signed)
split night on 8/6 & mslt on 8/7. Pt is on cpap, Lynnae Sandhoff wants to know if study should be cpap titration instead split night. Will need new order if should be cpap titration.   CY please advise. thanks

## 2016-05-14 NOTE — Telephone Encounter (Signed)
Recommend NPSG be done on his CPAP at 13, followed by MSLT next day

## 2016-05-17 ENCOUNTER — Ambulatory Visit (HOSPITAL_BASED_OUTPATIENT_CLINIC_OR_DEPARTMENT_OTHER): Payer: Medicare Other

## 2016-05-18 ENCOUNTER — Encounter (HOSPITAL_BASED_OUTPATIENT_CLINIC_OR_DEPARTMENT_OTHER): Payer: Self-pay

## 2016-05-18 NOTE — Telephone Encounter (Signed)
Attempted to call Sleep Center. There was no answer and I could not leave a message. Will try back.

## 2016-05-21 NOTE — Telephone Encounter (Signed)
These 2 tests have already been scheduled for 06/21/16 and 06/22/16. Message will be closed.

## 2016-06-15 DIAGNOSIS — Z23 Encounter for immunization: Secondary | ICD-10-CM | POA: Diagnosis not present

## 2016-06-21 ENCOUNTER — Encounter (HOSPITAL_BASED_OUTPATIENT_CLINIC_OR_DEPARTMENT_OTHER): Payer: Medicare Other

## 2016-06-22 ENCOUNTER — Encounter (HOSPITAL_BASED_OUTPATIENT_CLINIC_OR_DEPARTMENT_OTHER): Payer: Self-pay

## 2016-06-25 DIAGNOSIS — M5441 Lumbago with sciatica, right side: Secondary | ICD-10-CM | POA: Diagnosis not present

## 2016-06-25 DIAGNOSIS — M5416 Radiculopathy, lumbar region: Secondary | ICD-10-CM | POA: Diagnosis not present

## 2016-07-06 DIAGNOSIS — M5441 Lumbago with sciatica, right side: Secondary | ICD-10-CM | POA: Diagnosis not present

## 2016-07-10 DIAGNOSIS — M5416 Radiculopathy, lumbar region: Secondary | ICD-10-CM | POA: Diagnosis not present

## 2016-07-10 DIAGNOSIS — M5441 Lumbago with sciatica, right side: Secondary | ICD-10-CM | POA: Diagnosis not present

## 2016-07-31 DIAGNOSIS — E782 Mixed hyperlipidemia: Secondary | ICD-10-CM | POA: Diagnosis not present

## 2016-07-31 DIAGNOSIS — G4733 Obstructive sleep apnea (adult) (pediatric): Secondary | ICD-10-CM | POA: Diagnosis not present

## 2016-07-31 DIAGNOSIS — R7309 Other abnormal glucose: Secondary | ICD-10-CM | POA: Diagnosis not present

## 2016-07-31 DIAGNOSIS — M25551 Pain in right hip: Secondary | ICD-10-CM | POA: Diagnosis not present

## 2016-07-31 DIAGNOSIS — G47419 Narcolepsy without cataplexy: Secondary | ICD-10-CM | POA: Diagnosis not present

## 2016-07-31 DIAGNOSIS — R7303 Prediabetes: Secondary | ICD-10-CM | POA: Diagnosis not present

## 2016-07-31 DIAGNOSIS — I10 Essential (primary) hypertension: Secondary | ICD-10-CM | POA: Diagnosis not present

## 2016-07-31 DIAGNOSIS — I2510 Atherosclerotic heart disease of native coronary artery without angina pectoris: Secondary | ICD-10-CM | POA: Diagnosis not present

## 2016-07-31 LAB — HEPATIC FUNCTION PANEL
ALK PHOS: 117 (ref 25–125)
ALT: 27 (ref 10–40)
AST: 27 (ref 14–40)
BILIRUBIN, TOTAL: 0.5

## 2016-07-31 LAB — BASIC METABOLIC PANEL
BUN: 23 — AB (ref 4–21)
CREATININE: 1.2 (ref 0.6–1.3)
GLUCOSE: 91
POTASSIUM: 4.8 (ref 3.4–5.3)
Sodium: 142 (ref 137–147)

## 2016-07-31 LAB — LIPID PANEL
Cholesterol: 161 (ref 0–200)
HDL: 46 (ref 35–70)
LDL Cholesterol: 84
Triglycerides: 154 (ref 40–160)

## 2016-07-31 LAB — HEMOGLOBIN A1C: HEMOGLOBIN A1C: 5.8

## 2016-08-02 ENCOUNTER — Ambulatory Visit (HOSPITAL_BASED_OUTPATIENT_CLINIC_OR_DEPARTMENT_OTHER): Payer: Medicare Other | Attending: Internal Medicine | Admitting: Internal Medicine

## 2016-08-02 VITALS — Ht 73.0 in | Wt 215.0 lb

## 2016-08-02 DIAGNOSIS — Z9989 Dependence on other enabling machines and devices: Secondary | ICD-10-CM

## 2016-08-02 DIAGNOSIS — G4761 Periodic limb movement disorder: Secondary | ICD-10-CM | POA: Diagnosis not present

## 2016-08-02 DIAGNOSIS — G4733 Obstructive sleep apnea (adult) (pediatric): Secondary | ICD-10-CM | POA: Diagnosis not present

## 2016-08-03 ENCOUNTER — Ambulatory Visit (HOSPITAL_BASED_OUTPATIENT_CLINIC_OR_DEPARTMENT_OTHER): Payer: Medicare Other | Attending: Internal Medicine | Admitting: Internal Medicine

## 2016-08-03 DIAGNOSIS — G4719 Other hypersomnia: Secondary | ICD-10-CM | POA: Diagnosis not present

## 2016-08-03 DIAGNOSIS — G47419 Narcolepsy without cataplexy: Secondary | ICD-10-CM | POA: Insufficient documentation

## 2016-08-03 DIAGNOSIS — G4733 Obstructive sleep apnea (adult) (pediatric): Secondary | ICD-10-CM | POA: Diagnosis not present

## 2016-08-05 DIAGNOSIS — M5441 Lumbago with sciatica, right side: Secondary | ICD-10-CM | POA: Diagnosis not present

## 2016-08-06 ENCOUNTER — Telehealth: Payer: Self-pay | Admitting: Internal Medicine

## 2016-08-06 NOTE — Telephone Encounter (Signed)
Called and spoke to pt. Pt requesting PA to be initiated for Metadate ER 20mg  and requesting the results of recent sleep study. Metadate last written in 02/2016 but didn't fill the rx because of the cost and states he needed a diagnosis for narcolepsy - thus the sleep study. Will initiate PA on 11.3.17.   Dr. Annamaria Boots please advise on results of sleep study. Thanks.

## 2016-08-07 DIAGNOSIS — G4733 Obstructive sleep apnea (adult) (pediatric): Secondary | ICD-10-CM

## 2016-08-07 DIAGNOSIS — Z9989 Dependence on other enabling machines and devices: Secondary | ICD-10-CM

## 2016-08-07 NOTE — Telephone Encounter (Signed)
Will wait to get the results of the sleep study from [place].

## 2016-08-07 NOTE — Procedures (Signed)
    Patient Name: Aaron Humphrey, Aaron Humphrey Date: 08/03/2016 Gender: Male D.O.B: October 30, 1950 Age (years): 5 Referring Provider: Baird Lyons MD, ABSM Height (inches): 73 Interpreting Physician: Baird Lyons MD, ABSM Weight (lbs): 215 RPSGT: Jacolyn Reedy BMI: 28 MRN: [id number] Neck Size: 15.50 CLINICAL INFORMATION Sleep Study Type: MSLT   The patient was referred to the sleep center for evaluation of daytime sleepiness.   Epworth Sleepiness Score: 12 Prior night attended CPAP study on CPAP 17- AHI 0.9/ hr, TST 404 minutes, SL 1.7 minutes, REM 39.9% of TST, REM latency 55 minutes, PLM index 70.6/ hr  SLEEP STUDY TECHNIQUE A Multiple Sleep Latency Test was performed after an overnight polysomnogram according to the AASM scoring manual v2.3 (April 2016) and clinical guidelines. Five nap opportunities occurred over the course of the test which followed an overnight polysomnogram. The channels recorded and monitored were frontal, central, and occipital electroencephalography (EEG), right and left electrooculogram (EOG), chin electromyography (EMG), and electrocardiogram (EKG).  MEDICATIONS Medications taken by the patient : charted for review Medications administered by patient during sleep study : No sleep medicine administered. Patient wore CPAP 17 for each nap.  IMPRESSIONS - Total number of naps attempted: 5.00 . Total number of naps with sleep attained: 5.00 - The Mean Sleep Latency was 0.6 minutes. There were 0/5 sleep-onset REM periods. - The patient appears to have pathologic sleepiness, evidenced by a short mean sleep latency (8 minutes or less) on this MSLT. - No sleep onset REMs were noted during this MSLT. - NPSG the prior night had demonstrated short sleep latency, increased time in REM, normal sleep duration with good control of OSA on CPAP.  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  RECOMMENDATIONS - Return for follow up  to discuss management options.  [Electronically signed] 08/07/2016 03:40 PM  Baird Lyons MD, [place], American Board of Sleep Medicine   NPI: [id number] [place], American Board of Sleep Medicine  ELECTRONICALLY SIGNED ON:  08/07/2016, 3:24 PM [place] PH: [phone]   FX: [phone] [place]

## 2016-08-07 NOTE — Procedures (Signed)
Patient Name: Aaron Humphrey, Aaron Humphrey Date: 08/02/2016 Gender: Male D.O.B: 1951-03-07 Age (years): 79 Referring Provider: Baird Lyons MD, ABSM Height (inches): 73 Interpreting Physician: Baird Lyons MD, ABSM Weight (lbs): 215 RPSGT: Zadie Rhine BMI: 28 MRN: [id number] Neck Size: 15.50 CLINICAL INFORMATION The patient is referred for a CPAP titration to treat sleep apnea.   Date of NPSG, Split Night or HST: 05/14/05 NPSG  AHI 44/ hr.  MSLT  06/23/00 Mean Latency 1.4 minutes, SOREM 0/ 5  SLEEP STUDY TECHNIQUE As per the AASM Manual for the Scoring of Sleep and Associated Events v2.3 (April 2016) with a hypopnea requiring 4% desaturations. The channels recorded and monitored were frontal, central and occipital EEG, electrooculogram (EOG), submentalis EMG (chin), nasal and oral airflow, thoracic and abdominal wall motion, anterior tibialis EMG, snore microphone, electrocardiogram, and pulse oximetry. Continuous positive airway pressure (CPAP) was initiated at the beginning of the study and titrated to treat sleep-disordered breathing.  MEDICATIONS Medications self-administered by patient taken the night of the study : none  TECHNICIAN COMMENTS Comments added by technician: NO RESTROOM VISTED  Comments added by scorer: N/A  RESPIRATORY PARAMETERS Optimal PAP Pressure (cm): 17 AHI at Optimal Pressure (/hr): 0.9 Overall Minimal O2 (%): 93.00 Supine % at Optimal Pressure (%): 78 Minimal O2 at Optimal Pressure (%): 93.0    SLEEP ARCHITECTURE The study was initiated at 10:59:31 PM and ended at 5:54:12 AM. Sleep onset time was 1.7 minutes and the sleep efficiency was 97.5%. The total sleep time was 404.5 minutes. The patient spent 1.36% of the night in stage N1 sleep, 58.71% in stage N2 sleep, 0.00% in stage N3 and 39.93% in REM.Stage REM latency was 55.0 minutes Wake after sleep onset was 8.5. Alpha intrusion was absent. Supine sleep was 69.73%.  CARDIAC DATA The 2 lead EKG  demonstrated sinus rhythm. The mean heart rate was 56.37 beats per minute. Other EKG findings include: None.  LEG MOVEMENT DATA The total Periodic Limb Movements of Sleep (PLMS) were 476. The PLMS index was 70.61. A PLMS index of <15 is considered normal in adults.  IMPRESSIONS - The optimal PAP pressure was 17 cm of water. - Central sleep apnea was not noted during this titration (CAI = 0.9/h). - Significant oxygen desaturations were not observed during this titration (min O2 = 93.00%). - No snoring was audible during this study. - No cardiac abnormalities were observed during this study. - Severe periodic limb movements were observed during this study. Arousals associated with PLMs were rare. - Total Sleep Time 404 minutes, sleep latency 1.7 minutes, REM latency 55 minutes, 39.9% of sleep was in REM.  DIAGNOSIS - Obstructive Sleep Apnea (327.23 [G47.33 ICD-10]) - Periodic Limb Movement Syndrome (327.51 [G47.61 ICD-10])  RECOMMENDATIONS - Trial of CPAP therapy on 17 cm H2O with a Medium size Resmed Full Face Mask AirFit F20 mask and heated humidification. - Avoid alcohol, sedatives and other CNS depressants that may worsen sleep apnea and disrupt normal sleep architecture. - Sleep hygiene should be reviewed to assess factors that may improve sleep quality. - Weight management and regular exercise should be initiated or continued. - See result of MSLT following this study night.  [Electronically signed] 08/07/2016 03:19 PM  Baird Lyons MD, [place], American Board of Sleep Medicine   NPI: [id number]  [place], [place] of Sleep Medicine  ELECTRONICALLY SIGNED ON:  08/07/2016, 3:10 PM [place] PH: [phone]   FX: (336) [place]  OF SLEEP MEDICINE

## 2016-08-07 NOTE — Telephone Encounter (Signed)
If this study has been processed by the Sleep Center staff and ready for me to read, I should be able to take care of it this afternoon. Results of the test were going to be needed for the prior auth, so that needs to wait.

## 2016-08-11 NOTE — Telephone Encounter (Signed)
LMTCB

## 2016-08-11 NOTE — Telephone Encounter (Signed)
Pt calling to check on the status of results of sleep test, please advise.Aaron Humphrey

## 2016-08-12 NOTE — Telephone Encounter (Signed)
[facility] pharmacy to have PA forms on both doses of medadate faxed to our office.  Will await faxes.

## 2016-08-12 NOTE — Telephone Encounter (Signed)
Pt called back and he is aware of his results.  We will need to do the PA for the 2 medications that he takes for narcolepsy.

## 2016-08-12 NOTE — Telephone Encounter (Signed)
This is what I said in result notes after he had NPSG and MSLT sleep studies:   Notes Recorded by Deneise Lever, MD on 08/10/2016 at 1:09 PM EST Sleep studies- Night time study showed he needed to increase CPAP pressure to 17 to get good control of his sleep apnea.        Please Order- [facility]- increase CPAP to 17  Dx OSA  Daytime MSLT sleep study showed severe daytime sleepiness, consistent with an additional dx of Narcolepsy.         Order- please go ahead with Prior Auths for his two forms of Metadate if that his what he needs- please verify if he needs both scripts done now.

## 2016-08-12 NOTE — Telephone Encounter (Signed)
Pt wants to be contacted back at 12pm.  CY - please advise on sleep study results. Thanks.

## 2016-08-12 NOTE — Telephone Encounter (Signed)
Pt returned phone to get sleep study results. Contact # [phone], 12pm will be a good time to be reached.Aaron Humphrey

## 2016-08-13 NOTE — Telephone Encounter (Signed)
Pt called requesting to speak to a nurse.Aaron Humphrey # [phone].Aaron Humphrey

## 2016-08-13 NOTE — Telephone Encounter (Signed)
Spoke with pt. Advised him that we are still working on getting these PA's started. He verbalized understanding. I have checked and we have yet received these PA's from [facility].

## 2016-08-14 NOTE — Telephone Encounter (Signed)
atc [facility] again, was on hold Xseveral minutes, left message to fax PA forms again to our office.

## 2016-08-21 ENCOUNTER — Telehealth: Payer: Self-pay | Admitting: Internal Medicine

## 2016-08-21 NOTE — Telephone Encounter (Signed)
lmtcb

## 2016-08-21 NOTE — Telephone Encounter (Signed)
[phone] pt calling back

## 2016-08-21 NOTE — Telephone Encounter (Signed)
Patient calling back would like to pass on information. Contact # [phone].Aaron Humphrey

## 2016-08-26 NOTE — Telephone Encounter (Signed)
lmtcb for pt.  

## 2016-08-28 NOTE — Telephone Encounter (Signed)
lmomtcb x 3  

## 2016-08-28 NOTE — Telephone Encounter (Signed)
lmomtcb x 1  For the pt [facility] did not have a pending PA for the metadate Er 20 mg.  I have called the pt back and lmomtcb to see if he just needs a new RX for this.  [employer] has never covered the Metadate ER 20 for him. PA will have to be done for the methylphenidate or the metadate. ER.

## 2016-08-28 NOTE — Telephone Encounter (Signed)
Called and spoke with pt and he stated that he needs the PA done for the metadate 20 mg ER.  He is with [employer] and his ID is [id number]   [phone].

## 2016-08-28 NOTE — Telephone Encounter (Signed)
lmtcb X1 for pt  

## 2016-08-28 NOTE — Telephone Encounter (Signed)
Patient returned phone call... Contact # [phone].Aaron Humphrey

## 2016-08-28 NOTE — Telephone Encounter (Signed)
Patient very anxious about this PA - pt would like a call back today with status update- pr

## 2016-08-28 NOTE — Telephone Encounter (Signed)
I have faxed back PA/appeal request to Insurance today. Will await a decision mid week next week.

## 2016-08-31 NOTE — Telephone Encounter (Signed)
Spoke with [employer] from [employer]. The information that she was needing has been given to her. Will await appeal decision.

## 2016-08-31 NOTE — Telephone Encounter (Signed)
Aaron Humphrey from [employer] called requesting quantity clarification. She can be reached at [phone]

## 2016-08-31 NOTE — Telephone Encounter (Signed)
Patient called and left message with answering service on 08/28/16 @ 4:39pm regarding PA -pr

## 2016-09-02 ENCOUNTER — Telehealth: Payer: Self-pay | Admitting: Internal Medicine

## 2016-09-02 MED ORDER — METHYLPHENIDATE HCL ER 20 MG PO TBCR: EXTENDED_RELEASE_TABLET | ORAL | 0 refills | Status: DC

## 2016-09-02 NOTE — Telephone Encounter (Signed)
CDY, please ok if okay to refill metadate er 20 mg qd  He has an approval from ins now  Thanks

## 2016-09-02 NOTE — Telephone Encounter (Signed)
Called and spoke to pharmacist and was questioning if the Mythelphenidate 20mg  1-4 tablets daily as needed is correct. In pt's past medicine history it shows 10mg  and 20mg  with the same directions. Pharmacist states 80mg  is exceeding the daily maximum dose.   Dr Annamaria Boots please advise. Thanks.   Allergies  Allergen Reactions  . Ampicillin     Current Outpatient Prescriptions on File Prior to Visit  Medication Sig Dispense Refill  . amphetamine-dextroamphetamine (ADDERALL XR) 20 MG 24 hr capsule Take 1 capsule (20 mg total) by mouth daily. 30 capsule 0  . amphetamine-dextroamphetamine (ADDERALL) 20 MG tablet Take 1-4 tablets (20-80 mg total) by mouth daily. 120 tablet 0  . aspirin 81 MG tablet Take 162 mg by mouth daily.     Marland Kitchen atenolol (TENORMIN) 25 MG tablet Take 25 mg by mouth daily.      Marland Kitchen atorvastatin (LIPITOR) 40 MG tablet Take 40 mg by mouth daily.      . methylphenidate (METADATE ER) 20 MG ER tablet Take 1-4 tablets daily as needed 120 tablet 0  . methylphenidate (RITALIN LA) 20 MG 24 hr capsule Take 1 capsule (20 mg total) by mouth every morning. 30 capsule 0  . Multiple Vitamin (MULTIVITAMIN) tablet Take 1 tablet by mouth daily.      . Naproxen Sodium (ALEVE) 220 MG CAPS Take by mouth.      . vitamin B-12 (CYANOCOBALAMIN) 1000 MCG tablet Take 1,000 mcg by mouth daily.      . Vitamin D, Cholecalciferol, 1000 UNITS TABS Take 1 tablet by mouth daily.     No current facility-administered medications on file prior to visit.

## 2016-09-02 NOTE — Telephone Encounter (Signed)
Yes ok to refill 

## 2016-09-02 NOTE — Telephone Encounter (Signed)
rx printed out and signed by CY. I confirmed with the pt and he is only on the metadate 20 mg ER  1-4 tabs daily as needed.  rx has been left up front for pt to pick up and nothing further is needed.

## 2016-09-02 NOTE — Telephone Encounter (Signed)
Patient called and states he spoke to [place] today and rx has been approved pt would like to pick rx for the metadate today. He can be reached at [phone] - pt states he is at work and can't always pick up his cell phone so please leave message -pr

## 2016-09-03 NOTE — Telephone Encounter (Signed)
Directions were written to give him maximum flexibility between the two dose strengths. He has done very well with no concerns about side effects or diversion. If Pharmacy can't fill as directed, we will rewrite, but ai recommend filling as written if possible. We had gotten the Prior Auth for this dosage.

## 2016-09-03 NOTE — Telephone Encounter (Signed)
Patient is calling to check the status of this. I told him Dr. Annamaria Boots had sent nurses a msg with info the pharmacy is requesting and they will be contacting the pharmacy. He would like to know when the pharmacy is called.  CB is [phone] and may leave a msg on VM.

## 2016-09-03 NOTE — Telephone Encounter (Signed)
Called and spoke with pharmacist and nothing further is needed. They will fill this medication for the pt.

## 2016-09-20 ENCOUNTER — Encounter (HOSPITAL_BASED_OUTPATIENT_CLINIC_OR_DEPARTMENT_OTHER): Payer: Self-pay | Admitting: Emergency Medicine

## 2016-09-20 ENCOUNTER — Emergency Department (HOSPITAL_BASED_OUTPATIENT_CLINIC_OR_DEPARTMENT_OTHER)
Admission: EM | Admit: 2016-09-20 | Discharge: 2016-09-20 | Disposition: A | Discharge status: Home / Self Care | Payer: Worker's Compensation | Attending: Emergency Medicine | Admitting: Emergency Medicine

## 2016-09-20 DIAGNOSIS — E119 Type 2 diabetes mellitus without complications: Secondary | ICD-10-CM | POA: Diagnosis not present

## 2016-09-20 DIAGNOSIS — S01111A Laceration without foreign body of right eyelid and periocular area, initial encounter: Secondary | ICD-10-CM | POA: Diagnosis not present

## 2016-09-20 DIAGNOSIS — W269XXA Contact with unspecified sharp object(s), initial encounter: Secondary | ICD-10-CM | POA: Diagnosis not present

## 2016-09-20 DIAGNOSIS — I2510 Atherosclerotic heart disease of native coronary artery without angina pectoris: Secondary | ICD-10-CM | POA: Insufficient documentation

## 2016-09-20 DIAGNOSIS — I10 Essential (primary) hypertension: Secondary | ICD-10-CM | POA: Insufficient documentation

## 2016-09-20 DIAGNOSIS — Y939 Activity, unspecified: Secondary | ICD-10-CM | POA: Insufficient documentation

## 2016-09-20 DIAGNOSIS — Z79899 Other long term (current) drug therapy: Secondary | ICD-10-CM | POA: Insufficient documentation

## 2016-09-20 DIAGNOSIS — Y929 Unspecified place or not applicable: Secondary | ICD-10-CM | POA: Diagnosis not present

## 2016-09-20 DIAGNOSIS — S0181XA Laceration without foreign body of other part of head, initial encounter: Secondary | ICD-10-CM

## 2016-09-20 DIAGNOSIS — Y990 Civilian activity done for income or pay: Secondary | ICD-10-CM | POA: Diagnosis not present

## 2016-09-20 NOTE — ED Notes (Signed)
ED Provider at bedside. 

## 2016-09-20 NOTE — ED Provider Notes (Addendum)
[facility] Provider Note   CSN: [id number] Arrival date & time: 09/20/16  1509  By signing my name below, I, Julien Nordmann, attest that this documentation has been prepared under the direction and in the presence of Isla Pence, MD.  Electronically Signed: Julien Nordmann, ED Scribe. 09/20/16. 4:10 PM.    History   Chief Complaint Chief Complaint  Patient presents with  . Laceration    r eyebrow    HPI HPI Comments: Aaron Humphrey is a 65 y.o. male who presents to the Emergency Department complaining of a 1 cm laceration to his right eyebrow that developed from a fall at his work place several hours previous. The patient denies any LOC, trouble with his vision, nausea, and any other associated injuries. The patients has taken aspirin. His last tetanus shot was 4 years ago.   The history is provided by the patient. No language interpreter was used.    Past Medical History:  Diagnosis Date  . Abdominal distension   . Abdominal pain   . CAD (coronary artery disease), native coronary artery 2002   severe 3 vessel s/p CABG with LIMA to LAD, RIMA to RCA and SVG to diag.  . DM (diabetes mellitus) ([place])    broderline  . GERD (gastroesophageal reflux disease)   . Hypertension, essential, benign   . Narcolepsy   . Other and unspecified hyperlipidemia   . RBBB 08/23/2015  . Unspecified sleep apnea    NPSG 05-14-05-AHI 44/hr    Patient Active Problem List   Diagnosis Date Noted  . 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 CAD (coronary artery disease), native coronary artery 08/23/2015  . RBBB 08/23/2015  . Inguinal hernia unilateral, non-recurrent 10/08/2011  . 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 Dyslipidemia, goal LDL below 70 07/10/2008  . Essential hypertension 07/10/2008  . ALLERGIC RHINITIS 07/10/2008  . Obstructive sleep apnea 07/10/2008  . INGUINAL HERNIA, HX OF 07/10/2008    Past Surgical History:  Procedure Laterality Date  . CORONARY  ARTERY BYPASS GRAFT     4V 2002  . ESOPHAGEAL DILATION     x 3  . HERNIA REPAIR    . inquinal hernia repair    . NASAL SINUS SURGERY  1978       Home Medications    Prior to Admission medications   Medication Sig Start Date End Date Taking? Authorizing Provider  aspirin 81 MG tablet Take 162 mg by mouth daily.    Yes Historical Provider, MD  atenolol (TENORMIN) 25 MG tablet Take 25 mg by mouth daily.     Yes Historical Provider, MD  atorvastatin (LIPITOR) 40 MG tablet Take 40 mg by mouth daily.     Yes Historical Provider, MD  methylphenidate (METADATE ER) 20 MG ER tablet Take 1-4 tablets daily as needed 09/02/16 09/02/17 Yes Deneise Lever, MD  methylphenidate (RITALIN LA) 20 MG 24 hr capsule Take 1 capsule (20 mg total) by mouth every morning. 12/31/15  Yes Deneise Lever, MD  Multiple Vitamin (MULTIVITAMIN) tablet Take 1 tablet by mouth daily.     Yes Historical Provider, MD  Naproxen Sodium (ALEVE) 220 MG CAPS Take by mouth.     Yes Historical Provider, MD  amphetamine-dextroamphetamine (ADDERALL XR) 20 MG 24 hr capsule Take 1 capsule (20 mg total) by mouth daily. 04/01/16   Deneise Lever, MD  amphetamine-dextroamphetamine (ADDERALL) 20 MG tablet Take 1-4 tablets (20-80 mg total) by mouth daily. 04/01/16   Deneise Lever, MD  vitamin B-12 (CYANOCOBALAMIN) 1000 MCG tablet Take 1,000 mcg by mouth daily.      Historical Provider, MD  Vitamin D, Cholecalciferol, 1000 UNITS TABS Take 1 tablet by mouth daily.    Historical Provider, MD    Family History History reviewed. No pertinent family history.  Social History Social History  Substance Use Topics  . Smoking status: Never Smoker  . Smokeless tobacco: Never Used  . Alcohol use No     Allergies   Ampicillin   Review of Systems Review of Systems  Skin: Positive for wound.  All other systems reviewed and are negative.   A complete 10 system review of systems was obtained and all systems are negative except as noted  in the HPI and PMH.    Physical Exam Updated Vital Signs BP 158/90 (BP Location: Left Arm)   Pulse (!) 59   Temp 98.1 F (36.7 C) (Oral)   Resp 20   Ht 6\' 1"  (1.854 m)   Wt 210 lb (95.3 kg)   SpO2 98%   BMI 27.71 kg/m   Physical Exam  Constitutional: He is oriented to person, place, and time. He appears well-developed and well-nourished.  HENT:  Head: Head is with laceration.  Eyes: EOM are normal.  Neck: Normal range of motion.  Cardiovascular: Normal rate, regular rhythm, normal heart sounds and intact distal pulses.   Pulmonary/Chest: Effort normal and breath sounds normal. No respiratory distress.  Abdominal: Soft. He exhibits no distension. There is no tenderness.  Musculoskeletal: Normal range of motion.  Neurological: He is alert and oriented to person, place, and time.  Skin: Skin is warm and dry.  Psychiatric: He has a normal mood and affect. Judgment normal.  Nursing note and vitals reviewed.    ED Treatments / Results   DIAGNOSTIC STUDIES: Oxygen Saturation is 98% on room air, normal by my interpretation.  COORDINATION OF CARE: 4:03 PM Discussed treatment plan with pt at bedside and pt agreed to plan.   Labs (all labs ordered are listed, but only abnormal results are displayed) Labs Reviewed - No data to display  EKG  EKG Interpretation None       Radiology No results found.  Procedures .Marland KitchenLaceration Repair Date/Time: 09/20/2016 4:15 PM Performed by: Isla Pence Authorized by: Isla Pence   Consent:    Consent obtained:  Verbal   Consent given by:  Patient   Risks discussed:  Need for additional repair Universal protocol:    Procedure explained and questions answered to patient or proxy's satisfaction: yes     Relevant documents present and verified: yes     Test results available and properly labeled: yes     Imaging studies available: yes     Required blood products, implants, devices, and special equipment available: yes      Site/side marked: yes     Immediately prior to procedure, a time out was called: yes     Patient identity confirmed:  Verbally with patient Anesthesia (see MAR for exact dosages):    Anesthesia method:  None Laceration details:    Location:  Face   Face location:  R eyebrow   Length (cm):  1 Repair type:    Repair type:  Simple Treatment:    Amount of cleaning:  Standard   Irrigation solution:  Sterile saline Skin repair:    Repair method:  Tissue adhesive Approximation:    Approximation:  Close   Vermilion border: well-aligned   Post-procedure details:    Dressing:  Open (no dressing)   Patient tolerance of procedure:  Tolerated well, no immediate complications   (including critical care time)  Medications Ordered in ED Medications - No data to display   Initial Impression / Assessment and Plan / ED Course  I have reviewed the triage vital signs and the nursing notes.  Pertinent labs & imaging results that were available during my care of the patient were reviewed by me and considered in my medical decision making (see chart for details).  Clinical Course    Dermabond applied.    Final Clinical Impressions(s) / ED Diagnoses   Final diagnoses:  Facial laceration, initial encounter   I personally performed the services described in this documentation, which was scribed in my presence. The recorded information has been reviewed and is accurate.    New Prescriptions New Prescriptions   No medications on file     Isla Pence, MD 09/20/16 [id number]    Isla Pence, MD 09/20/16 [phone]

## 2016-09-20 NOTE — ED Triage Notes (Signed)
Patient was hit on the left eyebrow with a piece of equipment at work. Small laceration noted to eyebrow, no bleeding at this time. Patient has workers comp forms with him.

## 2016-09-20 NOTE — ED Notes (Signed)
States hit his head on a piece of equipment at work about 20 min ago, about 1cm lac to right eye brow, bleeding controlled, no LOC or n/v or visual changes

## 2016-09-22 DIAGNOSIS — M5416 Radiculopathy, lumbar region: Secondary | ICD-10-CM | POA: Diagnosis not present

## 2016-11-11 ENCOUNTER — Telehealth: Payer: Self-pay | Admitting: Internal Medicine

## 2016-11-11 NOTE — Telephone Encounter (Signed)
Will await pt's call back.

## 2016-11-12 NOTE — Telephone Encounter (Signed)
lmtcb x1 for pt. 

## 2016-11-13 NOTE — Telephone Encounter (Signed)
lmomtcb x 3  

## 2016-11-13 NOTE — Telephone Encounter (Signed)
Patient returning phone call 

## 2016-11-13 NOTE — Telephone Encounter (Signed)
lmtcb x1 for pt. 

## 2016-11-16 MED ORDER — METHYLPHENIDATE HCL ER 20 MG PO TBCR: EXTENDED_RELEASE_TABLET | ORAL | 0 refills | Status: DC

## 2016-11-16 NOTE — Telephone Encounter (Signed)
rx has been printed out .  Will need to notify the pt once this has been signed so he may come by and pick this up.

## 2016-11-16 NOTE — Telephone Encounter (Signed)
Called and spoke with pt and he is requesting a refill of the metadate ER 20  Take 1-4 tabs daily  #120.  Pt would like to pick this up today.  CY please advise if you are ok with this refill.  Last given back in 08/2016.   Last ov--01/14/15 Next ov--no pending appts.   Allergies  Allergen Reactions  . Ampicillin

## 2016-11-16 NOTE — Telephone Encounter (Signed)
Patient returned call, CB is [phone].

## 2016-11-16 NOTE — Telephone Encounter (Signed)
lmtcb x2 for pt. 

## 2016-11-16 NOTE — Telephone Encounter (Signed)
Ok to refill  Also please keep track of ov appointment- we should see him once a year while on controlled drug

## 2016-11-16 NOTE — Telephone Encounter (Signed)
Called and spoke with pt and he is aware of rx that has been placed up front and he will come by to pick this up.

## 2016-11-27 ENCOUNTER — Emergency Department (HOSPITAL_COMMUNITY)
Admission: EM | Admit: 2016-11-27 | Discharge: 2016-11-27 | Disposition: A | Discharge status: Home / Self Care | Payer: Medicare Other | Attending: Emergency Medicine | Admitting: Emergency Medicine

## 2016-11-27 ENCOUNTER — Ambulatory Visit (HOSPITAL_COMMUNITY)
Admission: EM | Admit: 2016-11-27 | Discharge: 2016-11-27 | Disposition: A | Discharge status: Home / Self Care | Payer: Medicare Other | Attending: Family Medicine | Admitting: Family Medicine

## 2016-11-27 ENCOUNTER — Encounter (HOSPITAL_COMMUNITY): Payer: Self-pay

## 2016-11-27 ENCOUNTER — Encounter (HOSPITAL_COMMUNITY): Payer: Self-pay | Admitting: Emergency Medicine

## 2016-11-27 DIAGNOSIS — Z79899 Other long term (current) drug therapy: Secondary | ICD-10-CM | POA: Diagnosis not present

## 2016-11-27 DIAGNOSIS — K469 Unspecified abdominal hernia without obstruction or gangrene: Secondary | ICD-10-CM | POA: Diagnosis present

## 2016-11-27 DIAGNOSIS — I10 Essential (primary) hypertension: Secondary | ICD-10-CM | POA: Diagnosis not present

## 2016-11-27 DIAGNOSIS — E119 Type 2 diabetes mellitus without complications: Secondary | ICD-10-CM | POA: Diagnosis not present

## 2016-11-27 DIAGNOSIS — K429 Umbilical hernia without obstruction or gangrene: Secondary | ICD-10-CM | POA: Diagnosis not present

## 2016-11-27 DIAGNOSIS — I2510 Atherosclerotic heart disease of native coronary artery without angina pectoris: Secondary | ICD-10-CM | POA: Insufficient documentation

## 2016-11-27 DIAGNOSIS — R1033 Periumbilical pain: Secondary | ICD-10-CM

## 2016-11-27 DIAGNOSIS — Z7982 Long term (current) use of aspirin: Secondary | ICD-10-CM | POA: Diagnosis not present

## 2016-11-27 DIAGNOSIS — K439 Ventral hernia without obstruction or gangrene: Secondary | ICD-10-CM | POA: Diagnosis not present

## 2016-11-27 LAB — COMPREHENSIVE METABOLIC PANEL
ALT: 30 U/L (ref 17–63)
AST: 34 U/L (ref 15–41)
Albumin: 3.8 g/dL (ref 3.5–5.0)
Alkaline Phosphatase: 88 U/L (ref 38–126)
Anion gap: 7 (ref 5–15)
BUN: 21 mg/dL — ABNORMAL HIGH (ref 6–20)
CO2: 26 mmol/L (ref 22–32)
CREATININE: 1.2 mg/dL (ref 0.61–1.24)
Calcium: 9.2 mg/dL (ref 8.9–10.3)
Chloride: 106 mmol/L (ref 101–111)
GFR calc non Af Amer: >60 mL/min (ref >60)
Glucose, Bld: 95 mg/dL (ref 65–99)
POTASSIUM: 4.2 mmol/L (ref 3.5–5.1)
SODIUM: 139 mmol/L (ref 135–145)
Total Bilirubin: 0.7 mg/dL (ref 0.3–1.2)
Total Protein: 6.8 g/dL (ref 6.5–8.1)

## 2016-11-27 LAB — CBC WITH DIFFERENTIAL/PLATELET
Basophils Absolute: 0 10*3/uL (ref 0.0–0.1)
Basophils Relative: 1 %
EOS PCT: 9 %
Eosinophils Absolute: 0.5 10*3/uL (ref 0.0–0.7)
HCT: 43.2 % (ref 39.0–52.0)
Hemoglobin: 14.8 g/dL (ref 13.0–17.0)
LYMPHS ABS: 1 10*3/uL (ref 0.7–4.0)
Lymphocytes Relative: 18 %
MCH: 32.2 pg (ref 26.0–34.0)
MCHC: 34.3 g/dL (ref 30.0–36.0)
MCV: 94.1 fL (ref 78.0–100.0)
Monocytes Absolute: 0.5 10*3/uL (ref 0.1–1.0)
Monocytes Relative: 8 %
Neutro Abs: 3.5 10*3/uL (ref 1.7–7.7)
Neutrophils Relative %: 64 %
Platelets: 153 10*3/uL (ref 150–400)
RBC: 4.59 MIL/uL (ref 4.22–5.81)
RDW: 13.5 % (ref 11.5–15.5)
WBC: 5.6 10*3/uL (ref 4.0–10.5)

## 2016-11-27 LAB — LIPASE, BLOOD: Lipase: 31 U/L (ref 11–51)

## 2016-11-27 LAB — I-STAT CG4 LACTIC ACID, ED: LACTIC ACID, VENOUS: 0.65 mmol/L (ref 0.5–1.9)

## 2016-11-27 MED ORDER — HYDROMORPHONE HCL 1 MG/ML IJ SOLN
1.0000 mg | Freq: Once | INTRAMUSCULAR | Status: AC
  Administered 2016-11-27: 1 mg via INTRAMUSCULAR

## 2016-11-27 MED ORDER — ONDANSETRON 4 MG PO TBDP
ORAL_TABLET | ORAL | Status: AC
  Filled 2016-11-27: qty 1

## 2016-11-27 MED ORDER — SODIUM CHLORIDE 0.9 % IV BOLUS (SEPSIS)
1000.0000 mL | Freq: Once | INTRAVENOUS | Status: AC
  Administered 2016-11-27: 1000 mL via INTRAVENOUS

## 2016-11-27 MED ORDER — ONDANSETRON 4 MG PO TBDP
4.0000 mg | ORAL_TABLET | Freq: Once | ORAL | Status: AC
  Administered 2016-11-27: 4 mg via ORAL

## 2016-11-27 MED ORDER — HYDROMORPHONE HCL 1 MG/ML IJ SOLN
INTRAMUSCULAR | Status: AC
  Filled 2016-11-27: qty 1

## 2016-11-27 MED ORDER — FENTANYL CITRATE (PF) 100 MCG/2ML IJ SOLN
50.0000 ug | INTRAMUSCULAR | Status: DC | PRN
  Administered 2016-11-27: 50 ug via INTRAVENOUS
  Filled 2016-11-27: qty 2

## 2016-11-27 NOTE — ED Triage Notes (Signed)
Here for constant abd pain onset 1.5 hours ago around the umbilicus area.   Sx include: firm, tender to the touch, decreased appetite  Denies n,v, diarrhea, fevers, urinary sx  Hx of hernia.   A&O x4... NAD... Wife at bedside.

## 2016-11-27 NOTE — Discharge Instructions (Signed)
You have an umbilical hernia. It may be strangulated based on her signs and symptoms. We have given you 4 mg of Zofran, and 1 mg of Dilaudid. I recommend you go to the emergency room as soon as possible for further evaluation of your condition and possible surgical consult.

## 2016-11-27 NOTE — Discharge Instructions (Signed)
Return to see a physician if he develop recurrent pain, vomiting, blood in the stools. If you're hernia returns lie flat relax and hold steady pressure in hopes of reducing. If this doesn't work after a few attempts or after 2 hours U need to see a physician.  If you were given medicines take as directed.  If you are on coumadin or contraceptives realize their levels and effectiveness is altered by many different medicines.  If you have any reaction (rash, tongues swelling, other) to the medicines stop taking and see a physician.    If your blood pressure was elevated in the ER make sure you follow up for management with a primary doctor or return for chest pain, shortness of breath or stroke symptoms.  Please follow up as directed and return to the ER or see a physician for new or worsening symptoms.  Thank you. Vitals:   11/27/16 1538  BP: (!) 181/104  Pulse: 60  Resp: 18  Temp: 98.6 F (37 C)  TempSrc: Oral  SpO2: 97%  Weight: 210 lb (95.3 kg)  Height: 6\' 1"  (1.854 m)

## 2016-11-27 NOTE — ED Notes (Signed)
ED Provider at bedside. 

## 2016-11-27 NOTE — ED Provider Notes (Signed)
[facility] DEPT Provider Note   CSN: [id number] Arrival date & time: 11/27/16  1533     History   Chief Complaint Chief Complaint  Patient presents with  . Hernia    HPI Aaron Humphrey is a 66 y.o. male.  Patient presents with sudden onset umbilical pain starting proximally 39 hours ago. No radiation. No history of similar. Patient's had inguinal hernias but never umbilical hernia. Currently pain is constant. No fevers or infectious symptoms. Patient can feel a bulge.      Past Medical History:  Diagnosis Date  . Abdominal distension   . Abdominal pain   . CAD (coronary artery disease), native coronary artery 2002   severe 3 vessel s/p CABG with LIMA to LAD, RIMA to RCA and SVG to diag.  . DM (diabetes mellitus) ([place])    broderline  . GERD (gastroesophageal reflux disease)   . Hypertension, essential, benign   . Narcolepsy   . Other and unspecified hyperlipidemia   . RBBB 08/23/2015  . Unspecified sleep apnea    NPSG 05-14-05-AHI 44/hr    Patient Active Problem List   Diagnosis Date Noted  . 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 CAD (coronary artery disease), native coronary artery 08/23/2015  . RBBB 08/23/2015  . Inguinal hernia unilateral, non-recurrent 10/08/2011  . 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 Dyslipidemia, goal LDL below 70 07/10/2008  . Essential hypertension 07/10/2008  . ALLERGIC RHINITIS 07/10/2008  . Obstructive sleep apnea 07/10/2008  . INGUINAL HERNIA, HX OF 07/10/2008    Past Surgical History:  Procedure Laterality Date  . CORONARY ARTERY BYPASS GRAFT     4V 2002  . ESOPHAGEAL DILATION     x 3  . HERNIA REPAIR    . inquinal hernia repair    . NASAL SINUS SURGERY  1978       Home Medications    Prior to Admission medications   Medication Sig Start Date End Date Taking? Authorizing Provider  amphetamine-dextroamphetamine (ADDERALL XR) 20 MG 24 hr capsule Take 1 capsule (20 mg total) by mouth daily. 04/01/16    Deneise Lever, MD  amphetamine-dextroamphetamine (ADDERALL) 20 MG tablet Take 1-4 tablets (20-80 mg total) by mouth daily. 04/01/16   Deneise Lever, MD  aspirin 81 MG tablet Take 162 mg by mouth daily.     Historical Provider, MD  atenolol (TENORMIN) 25 MG tablet Take 25 mg by mouth daily.      Historical Provider, MD  atorvastatin (LIPITOR) 40 MG tablet Take 40 mg by mouth daily.      Historical Provider, MD  methylphenidate (METADATE ER) 20 MG ER tablet Take 1-4 tablets daily as needed 11/16/16 11/16/17  Deneise Lever, MD  methylphenidate (RITALIN LA) 20 MG 24 hr capsule Take 1 capsule (20 mg total) by mouth every morning. 12/31/15   Deneise Lever, MD  Multiple Vitamin (MULTIVITAMIN) tablet Take 1 tablet by mouth daily.      Historical Provider, MD  Naproxen Sodium (ALEVE) 220 MG CAPS Take by mouth.      Historical Provider, MD  vitamin B-12 (CYANOCOBALAMIN) 1000 MCG tablet Take 1,000 mcg by mouth daily.      Historical Provider, MD  Vitamin D, Cholecalciferol, 1000 UNITS TABS Take 1 tablet by mouth daily.    Historical Provider, MD    Family History No family history on file.  Social History Social History  Substance Use Topics  . Smoking status: Never Smoker  . Smokeless tobacco: Never Used  .  Alcohol use No     Allergies   Ampicillin   Review of Systems Review of Systems  Constitutional: Negative for chills and fever.  HENT: Negative for congestion.   Eyes: Negative for visual disturbance.  Respiratory: Negative for shortness of breath.   Cardiovascular: Negative for chest pain.  Gastrointestinal: Positive for abdominal pain. Negative for vomiting.  Genitourinary: Negative for dysuria and flank pain.  Musculoskeletal: Negative for back pain, neck pain and neck stiffness.  Skin: Negative for rash.  Neurological: Negative for light-headedness and headaches.     Physical Exam Updated Vital Signs BP 158/94   Pulse (!) 58   Temp 98.6 F (37 C) (Oral)   Resp 12    Ht 6\' 1"  (1.854 m)   Wt 210 lb (95.3 kg)   SpO2 100%   BMI 27.71 kg/m   Physical Exam  Constitutional: He appears well-developed and well-nourished.  HENT:  Head: Normocephalic and atraumatic.  Eyes: Conjunctivae are normal.  Neck: Neck supple.  Cardiovascular: Normal rate and regular rhythm.   No murmur heard. Pulmonary/Chest: Effort normal and breath sounds normal. No respiratory distress.  Abdominal: Soft. There is tenderness.  Approximate 2 cm umbilical hernia palpated focal tenderness. No surrounding signs of infection. No change in color.  Musculoskeletal: He exhibits no edema.  Neurological: He is alert.  Skin: Skin is warm and dry.  Psychiatric: He has a normal mood and affect.  Nursing note and vitals reviewed.    ED Treatments / Results  Labs (all labs ordered are listed, but only abnormal results are displayed) Labs Reviewed  COMPREHENSIVE METABOLIC PANEL - Abnormal; Notable for the following:       Result Value   BUN 21 (*)    All other components within normal limits  CBC WITH DIFFERENTIAL/PLATELET  LIPASE, BLOOD  I-STAT CG4 LACTIC ACID, ED    EKG  EKG Interpretation None       Radiology No results found.  Procedures Procedures (including critical care time) Hernia reduction umbilical Indication pain Performed by myself. After pain medicines stay pressure applied to umbilical region hernia reduced without difficulty. Pain resolved. Medications Ordered in ED Medications  fentaNYL (SUBLIMAZE) injection 50 mcg (50 mcg Intravenous Given 11/27/16 1717)  sodium chloride 0.9 % bolus 1,000 mL (1,000 mLs Intravenous New Bag/Given 11/27/16 1717)     Initial Impression / Assessment and Plan / ED Course  I have reviewed the triage vital signs and the nursing notes.  Pertinent labs & imaging results that were available during my care of the patient were reviewed by me and considered in my medical decision making (see chart for details).    Patient  presented with clinically umbilical hernia otherwise well-appearing. Hernia reduced without difficulty. Screening blood work pending plan for outpatient follow up with general surgery. No pain on recheck. Results and differential diagnosis were discussed with the patient/parent/guardian. Xrays were independently reviewed by myself.  Close follow up outpatient was discussed, comfortable with the plan.   Medications  fentaNYL (SUBLIMAZE) injection 50 mcg (50 mcg Intravenous Given 11/27/16 1717)  sodium chloride 0.9 % bolus 1,000 mL (1,000 mLs Intravenous New Bag/Given 11/27/16 1717)    Vitals:   11/27/16 1538 11/27/16 1730 11/27/16 1800 11/27/16 1830  BP: (!) 181/104 164/99 158/92 158/94  Pulse: 60 (!) 52 (!) 54 (!) 58  Resp: 18 15 12 12   Temp: 98.6 F (37 C)     TempSrc: Oral     SpO2: 97% 97% 98% 100%  Weight: 210  lb (95.3 kg)     Height: 6\' 1"  (1.854 m)       Final diagnoses:  Umbilical hernia without obstruction and without gangrene     Final Clinical Impressions(s) / ED Diagnoses   Final diagnoses:  Umbilical hernia without obstruction and without gangrene    New Prescriptions New Prescriptions   No medications on file     Elnora Morrison, MD 11/27/16 [phone]

## 2016-11-27 NOTE — ED Provider Notes (Signed)
CSN: [id number]     Arrival date & time 11/27/16  1419 History   None    Chief Complaint  Patient presents with  . Abdominal Pain   (Consider location/radiation/quality/duration/timing/severity/associated sxs/prior Treatment) 66 year old male patient presents with sudden onset of sharp abdominal pain to the umbilicus area. He reports he was sitting at rest when this pain started, he can feel a mass approximately 1 inch above his umbilicus. He denies having a history of a hernia in this area, states he has not had a mass like this before. He denies trauma, he denies lifting any heavy objects, denies nausea, vomiting, diarrhea, fever, chills, or other systemic symptoms.   The history is provided by the patient and the spouse.  Abdominal Pain    Past Medical History:  Diagnosis Date  . Abdominal distension   . Abdominal pain   . CAD (coronary artery disease), native coronary artery 2002   severe 3 vessel s/p CABG with LIMA to LAD, RIMA to RCA and SVG to diag.  . DM (diabetes mellitus) ([place])    broderline  . GERD (gastroesophageal reflux disease)   . Hypertension, essential, benign   . Narcolepsy   . Other and unspecified hyperlipidemia   . RBBB 08/23/2015  . Unspecified sleep apnea    NPSG 05-14-05-AHI 44/hr   Past Surgical History:  Procedure Laterality Date  . CORONARY ARTERY BYPASS GRAFT     4V 2002  . ESOPHAGEAL DILATION     x 3  . HERNIA REPAIR    . inquinal hernia repair    . NASAL SINUS SURGERY  1978   History reviewed. No pertinent family history. Social History  Substance Use Topics  . Smoking status: Never Smoker  . Smokeless tobacco: Never Used  . Alcohol use No    Review of Systems  Reason unable to perform ROS: As covered in history of present illness.  Gastrointestinal: Positive for abdominal pain.  All other systems reviewed and are negative.   Allergies  Ampicillin  Home Medications   Prior to Admission medications   Medication Sig Start Date  End Date Taking? Authorizing Provider  aspirin 81 MG tablet Take 162 mg by mouth daily.    Yes Historical Provider, MD  atenolol (TENORMIN) 25 MG tablet Take 25 mg by mouth daily.     Yes Historical Provider, MD  atorvastatin (LIPITOR) 40 MG tablet Take 40 mg by mouth daily.     Yes Historical Provider, MD  methylphenidate (METADATE ER) 20 MG ER tablet Take 1-4 tablets daily as needed 11/16/16 11/16/17 Yes Deneise Lever, MD  Multiple Vitamin (MULTIVITAMIN) tablet Take 1 tablet by mouth daily.     Yes Historical Provider, MD  amphetamine-dextroamphetamine (ADDERALL XR) 20 MG 24 hr capsule Take 1 capsule (20 mg total) by mouth daily. 04/01/16   Deneise Lever, MD  amphetamine-dextroamphetamine (ADDERALL) 20 MG tablet Take 1-4 tablets (20-80 mg total) by mouth daily. 04/01/16   Deneise Lever, MD  methylphenidate (RITALIN LA) 20 MG 24 hr capsule Take 1 capsule (20 mg total) by mouth every morning. 12/31/15   Deneise Lever, MD  Naproxen Sodium (ALEVE) 220 MG CAPS Take by mouth.      Historical Provider, MD  vitamin B-12 (CYANOCOBALAMIN) 1000 MCG tablet Take 1,000 mcg by mouth daily.      Historical Provider, MD  Vitamin D, Cholecalciferol, 1000 UNITS TABS Take 1 tablet by mouth daily.    Historical Provider, MD   Meds Ordered and Administered  this Visit   Medications  ondansetron (ZOFRAN-ODT) disintegrating tablet 4 mg (4 mg Oral Given 11/27/16 1517)  HYDROmorphone (DILAUDID) injection 1 mg (1 mg Intramuscular Given 11/27/16 1517)    BP 182/90 (BP Location: Left Arm)   Pulse (!) 58   Temp 98 F (36.7 C) (Oral)   Resp 18   SpO2 99%  No data found.   Physical Exam  Constitutional: He is oriented to person, place, and time. He appears well-developed and well-nourished. He appears distressed.  HENT:  Head: Normocephalic and atraumatic.  Cardiovascular: Normal rate and regular rhythm.   Pulmonary/Chest: Effort normal and breath sounds normal.  Abdominal: Normal appearance. He exhibits mass  (Approximately 1" x 1" solid, firm, mass approximately 1 inch above the umbilicus). Bowel sounds are decreased. There is no hepatosplenomegaly, splenomegaly or hepatomegaly. There is tenderness in the periumbilical area. A hernia is present. Hernia confirmed positive in the ventral area.  Neurological: He is alert and oriented to person, place, and time.  Skin: Skin is warm and dry. Capillary refill takes less than 2 seconds. He is not diaphoretic.  Nursing note and vitals reviewed.   Urgent Care Course     Procedures (including critical care time)  Labs Review Labs Reviewed - No data to display  Imaging Review No results found.   Visual Acuity Review  Right Eye Distance:   Left Eye Distance:   Bilateral Distance:    Right Eye Near:   Left Eye Near:    Bilateral Near:         MDM   1. Periumbilical abdominal pain   2. Hernia of abdominal wall    You have an umbilical hernia. It may be strangulated based on her signs and symptoms. We have given you 4 mg of Zofran, and 1 mg of Dilaudid. I recommend you go to the emergency room as soon as possible for further evaluation of your condition and possible surgical consult.      Barnet Glasgow, NP 11/27/16 1544

## 2016-11-27 NOTE — ED Notes (Signed)
Pt states he noticed "hernia" a couple hours ago; states he "didn't lift or strain anything," states it started hurting so he "looked down and noticed it."

## 2016-11-27 NOTE — ED Triage Notes (Signed)
Per Pt, Pt is coming from UC where he was seen for abdominal pain. Pt was noted to have an Umbilical hernia and sent down here to be seen by a surgeon. Reports having a small bowel movement this morning.

## 2016-12-08 DIAGNOSIS — K429 Umbilical hernia without obstruction or gangrene: Secondary | ICD-10-CM | POA: Diagnosis not present

## 2016-12-25 DIAGNOSIS — M65321 Trigger finger, right index finger: Secondary | ICD-10-CM | POA: Diagnosis not present

## 2016-12-25 DIAGNOSIS — M65331 Trigger finger, right middle finger: Secondary | ICD-10-CM | POA: Diagnosis not present

## 2016-12-25 DIAGNOSIS — M65311 Trigger thumb, right thumb: Secondary | ICD-10-CM | POA: Diagnosis not present

## 2017-01-04 ENCOUNTER — Ambulatory Visit: Payer: Self-pay | Admitting: Acute Care

## 2017-01-21 DIAGNOSIS — I10 Essential (primary) hypertension: Secondary | ICD-10-CM | POA: Diagnosis not present

## 2017-01-21 DIAGNOSIS — M5431 Sciatica, right side: Secondary | ICD-10-CM | POA: Diagnosis not present

## 2017-01-25 ENCOUNTER — Ambulatory Visit: Payer: Self-pay | Admitting: Acute Care

## 2017-01-26 ENCOUNTER — Encounter (HOSPITAL_COMMUNITY): Payer: Self-pay | Admitting: Emergency Medicine

## 2017-01-26 DIAGNOSIS — Z7982 Long term (current) use of aspirin: Secondary | ICD-10-CM | POA: Insufficient documentation

## 2017-01-26 DIAGNOSIS — I2510 Atherosclerotic heart disease of native coronary artery without angina pectoris: Secondary | ICD-10-CM | POA: Insufficient documentation

## 2017-01-26 DIAGNOSIS — K439 Ventral hernia without obstruction or gangrene: Secondary | ICD-10-CM | POA: Diagnosis not present

## 2017-01-26 DIAGNOSIS — R109 Unspecified abdominal pain: Secondary | ICD-10-CM | POA: Diagnosis present

## 2017-01-26 DIAGNOSIS — I10 Essential (primary) hypertension: Secondary | ICD-10-CM | POA: Diagnosis not present

## 2017-01-26 DIAGNOSIS — Z79899 Other long term (current) drug therapy: Secondary | ICD-10-CM | POA: Insufficient documentation

## 2017-01-26 DIAGNOSIS — E119 Type 2 diabetes mellitus without complications: Secondary | ICD-10-CM | POA: Diagnosis not present

## 2017-01-26 LAB — URINALYSIS, ROUTINE W REFLEX MICROSCOPIC
Bilirubin Urine: NEGATIVE
GLUCOSE, UA: NEGATIVE mg/dL
Hgb urine dipstick: NEGATIVE
Ketones, ur: NEGATIVE mg/dL
Leukocytes, UA: NEGATIVE
Nitrite: NEGATIVE
Protein, ur: NEGATIVE mg/dL
SPECIFIC GRAVITY, URINE: 1.018 (ref 1.005–1.030)
pH: 7 (ref 5.0–8.0)

## 2017-01-26 LAB — COMPREHENSIVE METABOLIC PANEL
ALBUMIN: 3.9 g/dL (ref 3.5–5.0)
ALT: 26 U/L (ref 17–63)
AST: 33 U/L (ref 15–41)
Alkaline Phosphatase: 83 U/L (ref 38–126)
Anion gap: 7 (ref 5–15)
BUN: 21 mg/dL — ABNORMAL HIGH (ref 6–20)
CO2: 24 mmol/L (ref 22–32)
Calcium: 9.2 mg/dL (ref 8.9–10.3)
Chloride: 103 mmol/L (ref 101–111)
Creatinine, Ser: 1.04 mg/dL (ref 0.61–1.24)
GFR calc Af Amer: >60 mL/min (ref >60)
GFR calc non Af Amer: >60 mL/min (ref >60)
GLUCOSE: 131 mg/dL — AB (ref 65–99)
Potassium: 4.1 mmol/L (ref 3.5–5.1)
Sodium: 134 mmol/L — ABNORMAL LOW (ref 135–145)
Total Bilirubin: 0.9 mg/dL (ref 0.3–1.2)
Total Protein: 6.7 g/dL (ref 6.5–8.1)

## 2017-01-26 LAB — CBC
HCT: 45.6 % (ref 39.0–52.0)
Hemoglobin: 15.6 g/dL (ref 13.0–17.0)
MCH: 31.8 pg (ref 26.0–34.0)
MCHC: 34.2 g/dL (ref 30.0–36.0)
MCV: 92.9 fL (ref 78.0–100.0)
PLATELETS: 186 10*3/uL (ref 150–400)
RBC: 4.91 MIL/uL (ref 4.22–5.81)
RDW: 12.9 % (ref 11.5–15.5)
WBC: 13.1 10*3/uL — ABNORMAL HIGH (ref 4.0–10.5)

## 2017-01-26 LAB — LIPASE, BLOOD: LIPASE: 33 U/L (ref 11–51)

## 2017-01-26 NOTE — ED Triage Notes (Signed)
Pt c/o 8/10 abd pain on his umbilicus area, feeling nauseated at this time, denies fever, chills, vomiting or diarrhea. Hx of hernia repair.

## 2017-01-27 ENCOUNTER — Emergency Department (HOSPITAL_COMMUNITY)
Admission: EM | Admit: 2017-01-27 | Discharge: 2017-01-27 | Disposition: A | Discharge status: Home / Self Care | Payer: Medicare Other | Attending: Emergency Medicine | Admitting: Emergency Medicine

## 2017-01-27 DIAGNOSIS — K439 Ventral hernia without obstruction or gangrene: Secondary | ICD-10-CM | POA: Diagnosis not present

## 2017-01-27 DIAGNOSIS — K432 Incisional hernia without obstruction or gangrene: Secondary | ICD-10-CM

## 2017-01-27 MED ORDER — HYDROCODONE-ACETAMINOPHEN 5-325 MG PO TABS
1.0000 | ORAL_TABLET | Freq: Once | ORAL | Status: AC
  Administered 2017-01-27: 1 via ORAL
  Filled 2017-01-27: qty 1

## 2017-01-27 NOTE — Discharge Instructions (Signed)
Please read and follow all provided instructions.  Your diagnoses today include:  1. Ventral hernia, recurrent     Tests performed today include:  Blood counts and electrolytes - slightly high white blood cell count  Blood tests to check liver and kidney function  Blood tests to check pancreas function  Urine test to look for infection and pregnancy (in women)  Vital signs. See below for your results today.   Medications prescribed:   None  Take any prescribed medications only as directed.  Home care instructions:   Follow any educational materials contained in this packet.  No lifting for 1 week.   Follow-up instructions: Please follow-up with your primary care provider in the next 7 days for further evaluation of your symptoms.    Return instructions:  SEEK IMMEDIATE MEDICAL ATTENTION IF:  The pain does not go away or becomes severe   A temperature above 101F develops   Repeated vomiting occurs (multiple episodes)   The pain becomes localized to portions of the abdomen. The right side could possibly be appendicitis. In an adult, the left lower portion of the abdomen could be colitis or diverticulitis.   Blood is being passed in stools or vomit (bright red or black tarry stools)   You develop chest pain, difficulty breathing, dizziness or fainting, or become confused, poorly responsive, or inconsolable (young children)  If you have any other emergent concerns regarding your health  Additional Information: Abdominal (belly) pain can be caused by many things. Your caregiver performed an examination and possibly ordered blood/urine tests and imaging (CT scan, x-rays, ultrasound). Many cases can be observed and treated at home after initial evaluation in the emergency department. Even though you are being discharged home, abdominal pain can be unpredictable. Therefore, you need a repeated exam if your pain does not resolve, returns, or worsens. Most patients with  abdominal pain don't have to be admitted to the hospital or have surgery, but serious problems like appendicitis and gallbladder attacks can start out as nonspecific pain. Many abdominal conditions cannot be diagnosed in one visit, so follow-up evaluations are very important.  Your vital signs today were: BP 119/70 (BP Location: Right Arm)    Pulse (!) 50    Temp 97.8 F (36.6 C)    Resp 19    Ht 6\' 1"  (1.854 m)    Wt 93 kg    SpO2 97%    BMI 27.05 kg/m  If your blood pressure (bp) was elevated above 135/85 this visit, please have this repeated by your doctor within one month. --------------

## 2017-01-27 NOTE — ED Provider Notes (Signed)
[facility] DEPT Provider Note   CSN: [id number] Arrival date & time: 01/26/17  2206     History   Chief Complaint Chief Complaint  Patient presents with  . Abdominal Pain    HPI Aaron Humphrey is a 66 y.o. male.  Patient with history of inguinal hernia repair, ventral hernia seen by Dr. Hulen Skains who advised close monitoring/no surgery -- presents with recurrence of his ventral hernia this afternoon. Patient has a small defect in the abdominal wall. No previous surgeries in this area. Patient noted a painful bulge today. His wife was able to reduce the hernia but pushing on the sides of it for approximately 30 seconds. She states it felt like a worm that went back into a hole. Symptoms improved and now nearly resolved. Patient did not have any vomiting. He had a normal bowel movement today. No constipation. No fevers or other symptoms. No other treatments prior to arrival. Patient and his wife came to the ED because they were nervous it didn't reduce all of the way. Symptom onset acute. Course is resolved. Nothing makes symptoms better or worse.      Past Medical History:  Diagnosis Date  . Abdominal distension   . Abdominal pain   . CAD (coronary artery disease), native coronary artery 2002   severe 3 vessel s/p CABG with LIMA to LAD, RIMA to RCA and SVG to diag.  . DM (diabetes mellitus) ([place])    broderline  . GERD (gastroesophageal reflux disease)   . Hypertension, essential, benign   . Narcolepsy   . Other and unspecified hyperlipidemia   . RBBB 08/23/2015  . Unspecified sleep apnea    NPSG 05-14-05-AHI 44/hr    Patient Active Problem List   Diagnosis Date Noted  . 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 CAD (coronary artery disease), native coronary artery 08/23/2015  . RBBB 08/23/2015  . Inguinal hernia unilateral, non-recurrent 10/08/2011  . 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 Dyslipidemia, goal LDL below 70 07/10/2008  . Essential hypertension  07/10/2008  . ALLERGIC RHINITIS 07/10/2008  . Obstructive sleep apnea 07/10/2008  . INGUINAL HERNIA, HX OF 07/10/2008    Past Surgical History:  Procedure Laterality Date  . CORONARY ARTERY BYPASS GRAFT     4V 2002  . ESOPHAGEAL DILATION     x 3  . HERNIA REPAIR    . inquinal hernia repair    . NASAL SINUS SURGERY  1978       Home Medications    Prior to Admission medications   Medication Sig Start Date End Date Taking? Authorizing Provider  amphetamine-dextroamphetamine (ADDERALL XR) 20 MG 24 hr capsule Take 1 capsule (20 mg total) by mouth daily. 04/01/16   Deneise Lever, MD  amphetamine-dextroamphetamine (ADDERALL) 20 MG tablet Take 1-4 tablets (20-80 mg total) by mouth daily. 04/01/16   Deneise Lever, MD  aspirin 81 MG tablet Take 162 mg by mouth daily.     Historical Provider, MD  atenolol (TENORMIN) 25 MG tablet Take 25 mg by mouth daily.      Historical Provider, MD  atorvastatin (LIPITOR) 40 MG tablet Take 40 mg by mouth daily.      Historical Provider, MD  methylphenidate (METADATE ER) 20 MG ER tablet Take 1-4 tablets daily as needed 11/16/16 11/16/17  Deneise Lever, MD  methylphenidate (RITALIN LA) 20 MG 24 hr capsule Take 1 capsule (20 mg total) by mouth every morning. 12/31/15   Deneise Lever, MD  Multiple Vitamin (MULTIVITAMIN)  tablet Take 1 tablet by mouth daily.      Historical Provider, MD  Naproxen Sodium (ALEVE) 220 MG CAPS Take by mouth.      Historical Provider, MD  vitamin B-12 (CYANOCOBALAMIN) 1000 MCG tablet Take 1,000 mcg by mouth daily.      Historical Provider, MD  Vitamin D, Cholecalciferol, 1000 UNITS TABS Take 1 tablet by mouth daily.    Historical Provider, MD    Family History No family history on file.  Social History Social History  Substance Use Topics  . Smoking status: Never Smoker  . Smokeless tobacco: Never Used  . Alcohol use No     Allergies   Ampicillin   Review of Systems Review of Systems  Constitutional: Negative  for fever.  HENT: Negative for rhinorrhea and sore throat.   Eyes: Negative for redness.  Respiratory: Negative for cough.   Cardiovascular: Negative for chest pain.  Gastrointestinal: Positive for abdominal pain and nausea. Negative for constipation, diarrhea and vomiting.  Genitourinary: Negative for dysuria.  Musculoskeletal: Negative for myalgias.  Skin: Negative for rash.  Neurological: Negative for headaches.     Physical Exam Updated Vital Signs BP 119/70 (BP Location: Right Arm)   Pulse (!) 50   Temp 97.8 F (36.6 C)   Resp 19   Ht 6\' 1"  (1.854 m)   Wt 93 kg   SpO2 97%   BMI 27.05 kg/m   Physical Exam  Constitutional: He appears well-developed and well-nourished.  HENT:  Head: Normocephalic and atraumatic.  Eyes: Conjunctivae are normal. Right eye exhibits no discharge. Left eye exhibits no discharge.  Neck: Normal range of motion. Neck supple.  Cardiovascular: Normal rate, regular rhythm and normal heart sounds.   Pulmonary/Chest: Effort normal and breath sounds normal.  Abdominal: Soft. Bowel sounds are normal. He exhibits no distension. There is no tenderness. There is no rebound and no guarding. No hernia.  Patient with no tenderness to palpation on exam for supine and with standing. I do not feel any bulges or hernias in the periumbilical area while supine or standing.  Neurological: He is alert.  Skin: Skin is warm and dry.  Psychiatric: He has a normal mood and affect.  Nursing note and vitals reviewed.    ED Treatments / Results  Labs (all labs ordered are listed, but only abnormal results are displayed) Labs Reviewed  COMPREHENSIVE METABOLIC PANEL - Abnormal; Notable for the following:       Result Value   Sodium 134 (*)    Glucose, Bld 131 (*)    BUN 21 (*)    All other components within normal limits  CBC - Abnormal; Notable for the following:    WBC 13.1 (*)    All other components within normal limits  LIPASE, BLOOD  URINALYSIS, ROUTINE W  REFLEX MICROSCOPIC    Procedures Procedures (including critical care time)  Medications Ordered in ED Medications - No data to display   Initial Impression / Assessment and Plan / ED Course  I have reviewed the triage vital signs and the nursing notes.  Pertinent labs & imaging results that were available during my care of the patient were reviewed by me and considered in my medical decision making (see chart for details).     Patient seen and examined. Patient is currently completely asymptomatic. Patient and wife well versed on what to do to reduce hernia and when to seek medical attention.  Vital signs reviewed and are as follows: BP 119/70 (BP Location:  Right Arm)   Pulse (!) 50   Temp 97.8 F (36.6 C)   Resp 19   Ht 6\' 1"  (1.854 m)   Wt 93 kg   SpO2 97%   BMI 27.05 kg/m   We discussed imaging versus close monitoring at home. They are comfortable with monitoring at home. Patient is feeling much better and would like to go.  Encouraged return emergency department with worsening symptoms, pulsatile does not improve. Pain that worsens or accompanies symptoms including fevers, vomiting. Encouraged PCP/surgeon follow-up in 1 week. Encourage no lifting for 1 week.  Final Clinical Impressions(s) / ED Diagnoses   Final diagnoses:  Ventral hernia, recurrent   Patient with recurrent ventral hernia, reduced at home by wife. Now asymptomatic. No indication for imaging. Mild leukocytosis. Likely reactive.   New Prescriptions New Prescriptions   No medications on file     Carlisle Cater, PA-C 01/27/17 [place], MD 01/27/17 [phone]

## 2017-02-03 ENCOUNTER — Telehealth: Payer: Self-pay

## 2017-02-03 NOTE — Telephone Encounter (Signed)
Surgical clearance request received from [facility] for repair of umbilical hernia under general anesthesia. Surgery is not yet scheduled as clearance is needed first. They will need preoperative ASA holding instructions and a note of cardiac clearance.  Clearance to be faxed to: [facility] Attn: Tawni Pummel, RN  Fax# [phone]    Left message for patient to call back to schedule OV for clearance.

## 2017-02-04 NOTE — Telephone Encounter (Signed)
Patient has been scheduled 5/9.

## 2017-02-08 ENCOUNTER — Ambulatory Visit: Payer: Self-pay | Admitting: Physician Assistant

## 2017-02-10 ENCOUNTER — Ambulatory Visit (INDEPENDENT_AMBULATORY_CARE_PROVIDER_SITE_OTHER): Payer: Medicare Other | Admitting: Physician Assistant

## 2017-02-10 ENCOUNTER — Ambulatory Visit: Payer: Medicare Other | Admitting: Cardiology

## 2017-02-10 ENCOUNTER — Encounter: Payer: Self-pay | Admitting: Physician Assistant

## 2017-02-10 VITALS — BP 124/80 | HR 49 | Ht 73.0 in | Wt 204.1 lb

## 2017-02-10 DIAGNOSIS — I2510 Atherosclerotic heart disease of native coronary artery without angina pectoris: Secondary | ICD-10-CM

## 2017-02-10 DIAGNOSIS — I10 Essential (primary) hypertension: Secondary | ICD-10-CM

## 2017-02-10 DIAGNOSIS — E785 Hyperlipidemia, unspecified: Secondary | ICD-10-CM

## 2017-02-10 DIAGNOSIS — Z01810 Encounter for preprocedural cardiovascular examination: Secondary | ICD-10-CM

## 2017-02-10 MED ORDER — ASPIRIN EC 81 MG PO TBEC: 81.0000 mg | DELAYED_RELEASE_TABLET | Freq: Every day | ORAL | 0 refills | Status: AC

## 2017-02-10 NOTE — Progress Notes (Signed)
Cardiology Office Note:    Date:  02/10/2017   ID:  Aaron Humphrey, DOB 03-May-1951, MRN [id number]  PCP:  Antony Contras, MD  Cardiologist:  Dr. Fransico Him    Referring MD: Judeth Horn, MD   Chief Complaint  Patient presents with  . Surgical Clearance    History of Present Illness:    Aaron Humphrey is a 66 y.o. male with a hx of CAD status post CABG in 2002, HTN, HL, borderline diabetes, OSA, narcolepsy who is being seen today for surgical clearance at the request of Judeth Horn, MD.  Last seen by Dr. Radford Pax in 11/16.  He is here today with his wife.  He denies any chest pain.  He is not that active.  He does note dyspnea on exertion at work.  He works in the [employer] and has to lift 10-20 lb boxes.  He denies syncope, orthopnea, PND, edema.  He denies bleeding issues.  Of note, he states he did not have chest pain prior to his CABG.   Prior CV studies:   The following studies were reviewed today:  LHC 7/02 LAD proximal 80-90, occluded after D2; D2 proximal 80, 80-90 after branching; left-left collaterals LCx distal 70 RCA 60 EF 50  Past Medical History:  Diagnosis Date  . Abdominal distension   . Abdominal pain   . CAD (coronary artery disease), native coronary artery 2002   severe 3 vessel s/p CABG with LIMA to LAD, RIMA to RCA and SVG to diag.  . DM (diabetes mellitus) ([place])    broderline  . GERD (gastroesophageal reflux disease)   . Hypertension, essential, benign   . Narcolepsy   . Other and unspecified hyperlipidemia   . RBBB 08/23/2015  . Unspecified sleep apnea    NPSG 05-14-05-AHI 44/hr    Past Surgical History:  Procedure Laterality Date  . CORONARY ARTERY BYPASS GRAFT     4V 2002  . ESOPHAGEAL DILATION     x 3  . HERNIA REPAIR    . inquinal hernia repair    . NASAL SINUS SURGERY  1978    Current Medications: Current Meds  Medication Sig  . atenolol (TENORMIN) 25 MG tablet Take 25 mg by mouth daily.    Marland Kitchen atorvastatin  (LIPITOR) 40 MG tablet Take 40 mg by mouth daily.    . methylphenidate (METADATE ER) 20 MG ER tablet Take 1-4 tablets daily as needed  . Multiple Vitamin (MULTIVITAMIN) tablet Take 1 tablet by mouth daily.    . Naproxen Sodium (ALEVE) 220 MG CAPS Take by mouth.    . [DISCONTINUED] aspirin 81 MG tablet Take 162 mg by mouth daily.      Allergies:   Ampicillin   Social History   Social History  . Marital status: Married    Spouse name: N/A  . Number of children: N/A  . Years of education: N/A   Social History Main Topics  . Smoking status: Never Smoker  . Smokeless tobacco: Never Used  . Alcohol use No  . Drug use: No  . Sexual activity: Not Asked   Other Topics Concern  . None   Social History Narrative  . None     Family Hx: The patient's family history includes Stroke in his mother.  ROS:   Please see the history of present illness.    Review of Systems  All other systems reviewed and are negative.   EKGs/Labs/Other Test Reviewed:    EKG:  EKG  is  ordered today.  The ekg ordered today demonstrates sinus brady, HR 49, RBBB, QTc 466 ms, no changes  Recent Labs: 01/26/2017: ALT 26; BUN 21; Creatinine, Ser 1.04; Hemoglobin 15.6; Platelets 186; Potassium 4.1; Sodium 134   Recent Lipid Panel No results found for: CHOL, TRIG, HDL, CHOLHDL, VLDL, LDLCALC, LDLDIRECT   Physical Exam:    VS:  BP 124/80   Pulse (!) 49   Ht 6\' 1"  (1.854 m)   Wt 204 lb 1.9 oz (92.6 kg)   BMI 26.93 kg/m     Wt Readings from Last 3 Encounters:  02/10/17 204 lb 1.9 oz (92.6 kg)  01/26/17 205 lb (93 kg)  11/27/16 210 lb (95.3 kg)     Physical Exam  Constitutional: He is oriented to person, place, and time. He appears well-developed and well-nourished. No distress.  HENT:  Head: Normocephalic and atraumatic.  Eyes: No scleral icterus.  Neck: Normal range of motion. No JVD present. Carotid bruit is not present.  Cardiovascular: Normal rate, regular rhythm, S1 normal and S2 normal.     No murmur heard. Pulmonary/Chest: Effort normal and breath sounds normal. He has no wheezes. He has no rhonchi. He has no rales.  Abdominal: Soft. There is no tenderness.  Musculoskeletal: He exhibits no edema.  Neurological: He is alert and oriented to person, place, and time.  Skin: Skin is warm and dry.  Psychiatric: He has a normal mood and affect.    ASSESSMENT:    1. Preoperative cardiovascular examination   2. Coronary artery disease involving native coronary artery of native heart without angina pectoris   3. Essential hypertension   4. Dyslipidemia, goal LDL below 70    PLAN:    In order of problems listed above:  1. Preoperative cardiovascular examination -  His CABG was in 2002.  He has not had an assessment for ischemia in years.  He is not that active.  He did not have chest pain prior to his CABG.  He has felt somewhat short of breat with activity.  I recommend proceeding with stress testing for risk stratification.  Further recommendations to follow.    -  Arrange Lexiscan Myoview.  2. Coronary artery disease involving native coronary artery of native heart without angina pectoris -  s/p CABG in 2002.  He denies chest pain but never had chest pain prior to his CABG.  He needs hernia surgery with Dr. Hulen Skains.  Proceed with stress testing as noted.   -  Continue ASA (decrease to 81 mg QD), beta-blocker, statin.   3. Essential hypertension - BP controlled.  Continue current regimen.   4. Dyslipidemia, goal LDL below 70 - Managed by PCP.  LDL in 10/17 was 84.    Dispo:  Return in about 6 months (around 08/13/2017) for Routine Follow Up with Dr. Radford Pax.   Medication Adjustments/Labs and Tests Ordered: Current medicines are reviewed at length with the patient today.  Concerns regarding medicines are outlined above.  Orders/Tests:  Orders Placed This Encounter  Procedures  . Myocardial Perfusion Imaging  . EKG 12-Lead   Medication changes: Meds ordered this  encounter  Medications  . aspirin EC 81 MG tablet    Sig: Take 1 tablet (81 mg total) by mouth daily.    Dispense:  30 tablet    Refill:  0    Order Specific Question:   Supervising Provider    Answer:   Belva Crome [[id number]]   Signed, Richardson Dopp, PA-C  02/10/2017 2:54 PM    [facility] [place], [place], [place]  [address] Phone: [phone]; Fax: [phone]

## 2017-02-10 NOTE — Patient Instructions (Signed)
Medication Instructions:    Your physician recommends that you continue on your current medications as directed. Please refer to the Current Medication list given to you today.  Labwork:  None ordered  Testing/Procedures: Your physician has requested that you have a lexiscan myoview. For further information please visit [email or web address]. Please follow instruction sheet, as given.  Follow-Up:  Your physician wants you to follow-up in: 6 months with Dr. Radford Pax.  You will receive a reminder letter in the mail two months in advance. If you don't receive a letter, please call our office to schedule the follow-up appointment.  - If you need a refill on your cardiac medications before your next appointment, please call your pharmacy.    Thank you for choosing [facility]!!    Any Other Special Instructions Will Be Listed Below (If Applicable).  Pharmacologic Stress Electrocardiogram Introduction A pharmacologic stress electrocardiogram is a heart (cardiac) test that uses nuclear imaging to evaluate the blood supply to your heart. This test may also be called a pharmacologic stress electrocardiography. Pharmacologic means that a medicine is used to increase your heart rate and blood pressure. This stress test is done to find areas of poor blood flow to the heart by determining the extent of coronary artery disease (CAD). Some people exercise on a treadmill, which naturally increases the blood flow to the heart. For those people unable to exercise on a treadmill, a medicine is used. This medicine stimulates your heart and will cause your heart to beat harder and more quickly, as if you were exercising. Pharmacologic stress tests can help determine:  The adequacy of blood flow to your heart during increased levels of activity in order to clear you for discharge home.  The extent of coronary artery blockage caused by CAD.  Your prognosis if you have suffered a heart attack.  The  effectiveness of cardiac procedures done, such as an angioplasty, which can increase the circulation in your coronary arteries.  Causes of chest pain or pressure. LET [facility] CARE PROVIDER KNOW ABOUT:  Any allergies you have.  All medicines you are taking, including vitamins, herbs, eye drops, creams, and over-the-counter medicines.  Previous problems you or members of your family have had with the use of anesthetics.  Any blood disorders you have.  Previous surgeries you have had.  Medical conditions you have.  Possibility of pregnancy, if this applies.  If you are currently breastfeeding. RISKS AND COMPLICATIONS Generally, this is a safe procedure. However, as with any procedure, complications can occur. Possible complications include:  You develop pain or pressure in the following areas:  Chest.  Jaw or neck.  Between your shoulder blades.  Radiating down your left arm.  Headache.  Dizziness or light-headedness.  Shortness of breath.  Increased or irregular heartbeat.  Low blood pressure.  Nausea or vomiting.  Flushing.  Redness going up the arm and slight pain during injection of medicine.  Heart attack (rare). BEFORE THE PROCEDURE  Avoid all forms of caffeine for 24 hours before your test or as directed by your health care provider. This includes coffee, tea (even decaffeinated tea), caffeinated sodas, chocolate, cocoa, and certain pain medicines.  Follow your health care provider's instructions regarding eating and drinking before the test.  Take your medicines as directed at regular times with water unless instructed otherwise. Exceptions may include:  If you have diabetes, ask how you are to take your insulin or pills. It is common to adjust insulin dosing the morning of the test.  If you are taking beta-blocker medicines, it is important to talk to your health care provider about these medicines well before the date of your test. Taking  beta-blocker medicines may interfere with the test. In some cases, these medicines need to be changed or stopped 24 hours or more before the test.  If you wear a nitroglycerin patch, it may need to be removed prior to the test. Ask your health care provider if the patch should be removed before the test.  If you use an inhaler for any breathing condition, bring it with you to the test.  If you are an outpatient, bring a snack so you can eat right after the stress phase of the test.  Do not smoke for 4 hours prior to the test or as directed by your health care provider.  Do not apply lotions, powders, creams, or oils on your chest prior to the test.  Wear comfortable shoes and clothing. Let your health care provider know if you were unable to complete or follow the preparations for your test. PROCEDURE  Multiple patches (electrodes) will be put on your chest. If needed, small areas of your chest may be shaved to get better contact with the electrodes. Once the electrodes are attached to your body, multiple wires will be attached to the electrodes, and your heart rate will be monitored.  An IV access will be started. A nuclear trace (isotope) is given. The isotope may be given intravenously, or it may be swallowed. Nuclear refers to several types of radioactive isotopes, and the nuclear isotope lights up the arteries so that the nuclear images are clear. The isotope is absorbed by your body. This results in low radiation exposure.  A resting nuclear image is taken to show how your heart functions at rest.  A medicine is given through the IV access.  A second scan is done about 1 hour after the medicine injection and determines how your heart functions under stress.  During this stress phase, you will be connected to an electrocardiogram machine. Your blood pressure and oxygen levels will be monitored. What to expect after the procedure  Your heart rate and blood pressure will be monitored  after the test.  You may return to your normal schedule, including diet,activities, and medicines, unless your health care provider tells you otherwise. This information is not intended to replace advice given to you by your health care provider. Make sure you discuss any questions you have with your health care provider. Document Released: 02/07/2009 Document Revised: 02/27/2016 Document Reviewed: 03/30/2016 Elsevier Interactive Patient Education  2017 [employer].

## 2017-02-10 NOTE — Telephone Encounter (Signed)
See office note from 02/10/2017. The patient needs to undergo stress testing. A nuclear stress test will be scheduled. Further recommendations to follow. Richardson Dopp, PA-C    02/10/2017 2:51 PM

## 2017-02-11 ENCOUNTER — Telehealth (HOSPITAL_COMMUNITY): Payer: Self-pay | Admitting: *Deleted

## 2017-02-11 NOTE — Telephone Encounter (Signed)
Patient given detailed instructions per Myocardial Perfusion Study Information Sheet for the test on 02/12/17 at 0915. Patient notified to arrive 15 minutes early and that it is imperative to arrive on time for appointment to keep from having the test rescheduled.  If you need to cancel or reschedule your appointment, please call the office within 24 hours of your appointment. Failure to do so may result in a cancellation of your appointment, and a $50 no show fee. Patient verbalized understanding.Egidio Lofgren, [place]

## 2017-02-12 ENCOUNTER — Telehealth: Payer: Self-pay | Admitting: Physician Assistant

## 2017-02-12 ENCOUNTER — Ambulatory Visit (HOSPITAL_COMMUNITY): Payer: Medicare Other | Attending: Cardiology

## 2017-02-12 DIAGNOSIS — Z951 Presence of aortocoronary bypass graft: Secondary | ICD-10-CM | POA: Insufficient documentation

## 2017-02-12 DIAGNOSIS — I2510 Atherosclerotic heart disease of native coronary artery without angina pectoris: Secondary | ICD-10-CM | POA: Insufficient documentation

## 2017-02-12 DIAGNOSIS — Z01810 Encounter for preprocedural cardiovascular examination: Secondary | ICD-10-CM | POA: Insufficient documentation

## 2017-02-12 MED ORDER — TECHNETIUM TC 99M TETROFOSMIN IV KIT
10.3000 | PACK | Freq: Once | INTRAVENOUS | Status: AC | PRN
  Administered 2017-02-12: 10.3 via INTRAVENOUS
  Filled 2017-02-12: qty 11

## 2017-02-12 MED ORDER — REGADENOSON 0.4 MG/5ML IV SOLN
0.4000 mg | Freq: Once | INTRAVENOUS | Status: AC
  Administered 2017-02-12: 0.4 mg via INTRAVENOUS

## 2017-02-12 MED ORDER — TECHNETIUM TC 99M TETROFOSMIN IV KIT
32.5000 | PACK | Freq: Once | INTRAVENOUS | Status: AC | PRN
  Administered 2017-02-12: 32.5 via INTRAVENOUS
  Filled 2017-02-12: qty 33

## 2017-02-12 NOTE — Telephone Encounter (Signed)
Patient calling, states that he had a stress test today and would like to know when the results will be available so that he can find out when he can schedule his surgery. Patient states that if his wife picks up the phone, it is okay to speak with her, as he will be driving. Thanks.

## 2017-02-12 NOTE — Telephone Encounter (Signed)
Informed patient and his wife that Brantley Fling is still "in process" and that results should be available early next week. Informed them they will be called when results are available. They were grateful for call.

## 2017-02-15 ENCOUNTER — Telehealth: Payer: Self-pay

## 2017-02-15 ENCOUNTER — Encounter: Payer: Self-pay | Admitting: Physician Assistant

## 2017-02-15 LAB — MYOCARDIAL PERFUSION IMAGING
CHL CUP NUCLEAR SDS: 3
CHL CUP NUCLEAR SRS: 3
CHL CUP NUCLEAR SSS: 6
CHL CUP RESTING HR STRESS: 48 {beats}/min
CSEPPHR: 68 {beats}/min
LV dias vol: 124 mL (ref 62–150)
LV sys vol: 56 mL
RATE: 0.23
TID: 1.02

## 2017-02-15 NOTE — Telephone Encounter (Signed)
Made patient aware of results of his stress test. Patient was very glad to hear about his results and that he can have the surgery. Pt would like his stress test to be faxed to Dr. Hulen Skains and Dr. Erroll Luna, the surgeon who treats his hernia. Advised pt I would send it over to those surgeons as well as his PCP, Antony Contras, MD.

## 2017-02-15 NOTE — Telephone Encounter (Signed)
-----   Message from Liliane Shi, [place] sent at 02/15/2017  3:15 PM EDT ----- Please call the patient. The stress test does not show any significant ischemia (loss of blood flow from a blockage). Test reviewed with Dr. Fransico Him. No further testing needed and he can proceed with his surgery at acceptable risk.  Please send a copy of his stress to his surgeon. Please route a copy of this study result to his PCP:  Antony Contras, MD  Richardson Dopp, PA-C    02/15/2017 3:12 PM

## 2017-02-16 ENCOUNTER — Ambulatory Visit: Payer: Self-pay | Admitting: General Surgery

## 2017-02-16 ENCOUNTER — Encounter (HOSPITAL_COMMUNITY): Payer: Self-pay

## 2017-02-18 NOTE — Progress Notes (Addendum)
Cardiology clearance Kathlen Mody PA 02-15-17 note in epic EKG 02-10-17 epic Stress 02-15-17 epic CBC, CMP, UA 01-26-17 epic

## 2017-02-18 NOTE — Patient Instructions (Addendum)
OLUWADEMILADE MCKIVER  02/18/2017   Your procedure is scheduled on: 02-22-17  Report to [facility] Main  Entrance Take [place]  elevators to 3rd floor to  [place] at 730AM.   Call this number if you have problems the morning of surgery [phone]   Remember: ONLY 1 PERSON MAY GO WITH YOU TO SHORT STAY TO GET  READY MORNING OF YOUR SURGERY.  Do not eat food or drink liquids :After Midnight.  Bring cpap mask and tubing   Take these medicines the morning of surgery with A SIP OF WATER: atenolol(tenormin)                                You may not have any metal on your body including hair pins and              piercings  Do not wear jewelry, make-up, lotions, powders or perfumes, deodorant                    Men may shave face and neck.   Do not bring valuables to the hospital. [place].  Contacts, dentures or bridgework may not be worn into surgery.      Patients discharged the day of surgery will not be allowed to drive home.  Name and phone number of your driver: spouse dellie cell [phone]  Special Instructions: N/A              Please read over the following fact sheets you were given: _____________________________________________________________________             [facility] - Preparing for Surgery Before surgery, you can play an important role.  Because skin is not sterile, your skin needs to be as free of germs as possible.  You can reduce the number of germs on your skin by washing with CHG (chlorahexidine gluconate) soap before surgery.  CHG is an antiseptic cleaner which kills germs and bonds with the skin to continue killing germs even after washing. Please DO NOT use if you have an allergy to CHG or antibacterial soaps.  If your skin becomes reddened/irritated stop using the CHG and inform your nurse when you arrive at Short Stay. Do not shave (including legs and underarms) for at  least 48 hours prior to the first CHG shower.  You may shave your face/neck. Please follow these instructions carefully:  1.  Shower with CHG Soap the night before surgery and the  morning of Surgery.  2.  If you choose to wash your hair, wash your hair first as usual with your  normal  shampoo.  3.  After you shampoo, rinse your hair and body thoroughly to remove the  shampoo.                           4.  Use CHG as you would any other liquid soap.  You can apply chg directly  to the skin and wash                       Gently with a scrungie or clean washcloth.  5.  Apply the CHG Soap to your  body ONLY FROM THE NECK DOWN.   Do not use on face/ open                           Wound or open sores. Avoid contact with eyes, ears mouth and genitals (private parts).                       Wash face,  Genitals (private parts) with your normal soap.             6.  Wash thoroughly, paying special attention to the area where your surgery  will be performed.  7.  Thoroughly rinse your body with warm water from the neck down.  8.  DO NOT shower/wash with your normal soap after using and rinsing off  the CHG Soap.                9.  Pat yourself dry with a clean towel.            10.  Wear clean pajamas.            11.  Place clean sheets on your bed the night of your first shower and do not  sleep with pets. Day of Surgery : Do not apply any lotions/deodorants the morning of surgery.  Please wear clean clothes to the hospital/surgery center.  FAILURE TO FOLLOW THESE INSTRUCTIONS MAY RESULT IN THE CANCELLATION OF YOUR SURGERY PATIENT SIGNATURE_________________________________  NURSE SIGNATURE__________________________________  ________________________________________________________________________

## 2017-02-19 ENCOUNTER — Encounter (HOSPITAL_COMMUNITY)
Admission: RE | Admit: 2017-02-19 | Discharge: 2017-02-19 | Disposition: A | Discharge status: Home / Self Care | Payer: Medicare Other | Source: Ambulatory Visit | Attending: General Surgery | Admitting: General Surgery

## 2017-02-19 ENCOUNTER — Encounter (HOSPITAL_COMMUNITY): Payer: Self-pay

## 2017-02-19 ENCOUNTER — Ambulatory Visit (HOSPITAL_COMMUNITY)
Admission: RE | Admit: 2017-02-19 | Discharge: 2017-02-19 | Disposition: A | Discharge status: Home / Self Care | Payer: Medicare Other | Source: Ambulatory Visit | Attending: General Surgery | Admitting: General Surgery

## 2017-02-19 DIAGNOSIS — I10 Essential (primary) hypertension: Secondary | ICD-10-CM | POA: Diagnosis not present

## 2017-02-19 DIAGNOSIS — Z01818 Encounter for other preprocedural examination: Secondary | ICD-10-CM | POA: Insufficient documentation

## 2017-02-19 DIAGNOSIS — Z951 Presence of aortocoronary bypass graft: Secondary | ICD-10-CM | POA: Diagnosis not present

## 2017-02-19 DIAGNOSIS — Z01812 Encounter for preprocedural laboratory examination: Secondary | ICD-10-CM | POA: Diagnosis not present

## 2017-02-19 DIAGNOSIS — E785 Hyperlipidemia, unspecified: Secondary | ICD-10-CM | POA: Insufficient documentation

## 2017-02-19 DIAGNOSIS — K429 Umbilical hernia without obstruction or gangrene: Secondary | ICD-10-CM | POA: Diagnosis not present

## 2017-02-19 DIAGNOSIS — I2510 Atherosclerotic heart disease of native coronary artery without angina pectoris: Secondary | ICD-10-CM | POA: Diagnosis not present

## 2017-02-19 DIAGNOSIS — G4733 Obstructive sleep apnea (adult) (pediatric): Secondary | ICD-10-CM | POA: Diagnosis not present

## 2017-02-19 HISTORY — DX: Prediabetes: R73.03

## 2017-02-19 LAB — CBC WITH DIFFERENTIAL/PLATELET
Basophils Absolute: 0 10*3/uL (ref 0.0–0.1)
Basophils Relative: 1 %
EOS ABS: 0.3 10*3/uL (ref 0.0–0.7)
Eosinophils Relative: 6 %
HCT: 44.9 % (ref 39.0–52.0)
HEMOGLOBIN: 15.3 g/dL (ref 13.0–17.0)
LYMPHS ABS: 1.1 10*3/uL (ref 0.7–4.0)
Lymphocytes Relative: 22 %
MCH: 31.8 pg (ref 26.0–34.0)
MCHC: 34.1 g/dL (ref 30.0–36.0)
MCV: 93.3 fL (ref 78.0–100.0)
MONOS PCT: 10 %
Monocytes Absolute: 0.5 10*3/uL (ref 0.1–1.0)
NEUTROS PCT: 61 %
Neutro Abs: 3.2 10*3/uL (ref 1.7–7.7)
Platelets: 162 10*3/uL (ref 150–400)
RBC: 4.81 MIL/uL (ref 4.22–5.81)
RDW: 13.3 % (ref 11.5–15.5)
WBC: 5.2 10*3/uL (ref 4.0–10.5)

## 2017-02-19 LAB — BASIC METABOLIC PANEL
ANION GAP: 7 (ref 5–15)
BUN: 16 mg/dL (ref 6–20)
CHLORIDE: 107 mmol/L (ref 101–111)
CO2: 25 mmol/L (ref 22–32)
Calcium: 8.9 mg/dL (ref 8.9–10.3)
Creatinine, Ser: 1.23 mg/dL (ref 0.61–1.24)
GFR calc Af Amer: >60 mL/min (ref >60)
GFR calc non Af Amer: 59 mL/min — ABNORMAL LOW (ref >60)
GLUCOSE: 95 mg/dL (ref 65–99)
POTASSIUM: 4 mmol/L (ref 3.5–5.1)
SODIUM: 139 mmol/L (ref 135–145)

## 2017-02-19 NOTE — Progress Notes (Signed)
Pt had not arrived for pre-op appt at scheduled time. RN called home number listed in pt demographic/summary. Phone rang multiple times. There was no answer and the call went to a personal voicemail message. RN left message informing pt of a scheduled appt at [facility] for upcoming surgery along with call back number and instructions to call our scheduler. RN then gave chart to phone call room/scheduler and informed her of RN actions and situation. She called pt again in witness of this RN and again there was no answer.

## 2017-02-20 LAB — HEMOGLOBIN A1C
Hgb A1c MFr Bld: 5.9 % — ABNORMAL HIGH (ref 4.8–5.6)
Mean Plasma Glucose: 123 mg/dL

## 2017-02-22 ENCOUNTER — Encounter (HOSPITAL_COMMUNITY): Payer: Self-pay | Admitting: *Deleted

## 2017-02-22 NOTE — Progress Notes (Signed)
Pt denies SOB and chest pain. Pt under the care of Dr. Fransico Him, Cardiology. Pt made aware to stop taking vitamins, fish oil and herbal medications. Do not take any NSAIDs ie: Ibuprofen, Advil, Naproxen ( Aleve) , BC and Goody Powder or any medication containing Aspirin. Pt verbalized understanding of all pre-op instructions. Anesthesia asked to review pt history (see note).

## 2017-02-22 NOTE — Progress Notes (Signed)
Anesthesia Chart Review:  Pt is a same day work up  Pt is a 66 year old male scheduled for umbilical hernia repair with mesh on 02/23/2017 with Judeth Horn, MD  - PCP is Antony Contras, MD - Cardiologist is Fransico Him, MD. Last office visit 02/10/17 with Richardson Dopp, PA for pre-op evaluation.  Stress test ordered, results below. Patient cleared for surgery.  PMH includes:  CAD (S/P CABG 3 2002), HTN, RBBB, narcolepsy, pre-diabetes, hyperlipidemia. Never smoker. BMI 27.  Medications include: ASA 81 mg, atenolol, Lipitor, methylphenidate  Preoperative labs reviewed.  HbA1c 5.9, glucose 95  CXR 02/19/17: Prior CABG.  No acute pulmonary disease.  EKG 02/10/17: Marked sinus bradycardia (49 bpm). RBBB.  Nuclear stress test 02/12/17:   Nuclear stress EF: 55%.  There was no ST segment deviation noted during stress.  Inferior/inferoseptal defect that partially improves consistent with small region of mild ischemia and possible soft tissue attenuation.  Overall low risk study  If no changes, I anticipate pt can proceed with surgery as scheduled.   Willeen Cass, FNP-BC [facility]/Anesthesiology Phone: [phone] 02/22/2017 3:54 PM

## 2017-02-23 ENCOUNTER — Ambulatory Visit (HOSPITAL_COMMUNITY)
Admission: RE | Admit: 2017-02-23 | Discharge: 2017-02-24 | Disposition: A | Discharge status: Home / Self Care | Payer: Medicare Other | Source: Ambulatory Visit | Attending: General Surgery | Admitting: General Surgery

## 2017-02-23 ENCOUNTER — Ambulatory Visit (HOSPITAL_COMMUNITY): Payer: Medicare Other | Admitting: Certified Registered Nurse Anesthetist

## 2017-02-23 ENCOUNTER — Encounter (HOSPITAL_COMMUNITY): Payer: Self-pay | Admitting: Urology

## 2017-02-23 ENCOUNTER — Encounter (HOSPITAL_COMMUNITY)
Admission: RE | Disposition: A | Discharge status: Home / Self Care | Payer: Self-pay | Source: Ambulatory Visit | Attending: General Surgery

## 2017-02-23 DIAGNOSIS — Z823 Family history of stroke: Secondary | ICD-10-CM | POA: Insufficient documentation

## 2017-02-23 DIAGNOSIS — E785 Hyperlipidemia, unspecified: Secondary | ICD-10-CM | POA: Insufficient documentation

## 2017-02-23 DIAGNOSIS — Z880 Allergy status to penicillin: Secondary | ICD-10-CM | POA: Diagnosis not present

## 2017-02-23 DIAGNOSIS — G4733 Obstructive sleep apnea (adult) (pediatric): Secondary | ICD-10-CM | POA: Diagnosis not present

## 2017-02-23 DIAGNOSIS — Z7982 Long term (current) use of aspirin: Secondary | ICD-10-CM | POA: Diagnosis not present

## 2017-02-23 DIAGNOSIS — K429 Umbilical hernia without obstruction or gangrene: Secondary | ICD-10-CM | POA: Diagnosis not present

## 2017-02-23 DIAGNOSIS — Z79899 Other long term (current) drug therapy: Secondary | ICD-10-CM | POA: Insufficient documentation

## 2017-02-23 DIAGNOSIS — Z951 Presence of aortocoronary bypass graft: Secondary | ICD-10-CM | POA: Insufficient documentation

## 2017-02-23 DIAGNOSIS — I2510 Atherosclerotic heart disease of native coronary artery without angina pectoris: Secondary | ICD-10-CM | POA: Diagnosis not present

## 2017-02-23 DIAGNOSIS — G4730 Sleep apnea, unspecified: Secondary | ICD-10-CM | POA: Insufficient documentation

## 2017-02-23 DIAGNOSIS — I4510 Unspecified right bundle-branch block: Secondary | ICD-10-CM | POA: Diagnosis not present

## 2017-02-23 DIAGNOSIS — R7303 Prediabetes: Secondary | ICD-10-CM | POA: Diagnosis not present

## 2017-02-23 DIAGNOSIS — K420 Umbilical hernia with obstruction, without gangrene: Secondary | ICD-10-CM | POA: Diagnosis not present

## 2017-02-23 DIAGNOSIS — M5430 Sciatica, unspecified side: Secondary | ICD-10-CM | POA: Diagnosis not present

## 2017-02-23 DIAGNOSIS — Z881 Allergy status to other antibiotic agents status: Secondary | ICD-10-CM | POA: Diagnosis not present

## 2017-02-23 DIAGNOSIS — Z86010 Personal history of colonic polyps: Secondary | ICD-10-CM | POA: Insufficient documentation

## 2017-02-23 DIAGNOSIS — I10 Essential (primary) hypertension: Secondary | ICD-10-CM | POA: Insufficient documentation

## 2017-02-23 DIAGNOSIS — G47419 Narcolepsy without cataplexy: Secondary | ICD-10-CM | POA: Diagnosis not present

## 2017-02-23 HISTORY — PX: UMBILICAL HERNIA REPAIR: SUR1181

## 2017-02-23 HISTORY — PX: UMBILICAL HERNIA REPAIR: SHX196

## 2017-02-23 HISTORY — PX: INSERTION OF MESH: SHX5868

## 2017-02-23 HISTORY — DX: Sciatica, unspecified side: M54.30

## 2017-02-23 HISTORY — DX: Umbilical hernia without obstruction or gangrene: K42.9

## 2017-02-23 LAB — GLUCOSE, CAPILLARY: Glucose-Capillary: 98 mg/dL (ref 65–99)

## 2017-02-23 SURGERY — REPAIR, HERNIA, UMBILICAL, ADULT
Anesthesia: General | Site: Abdomen

## 2017-02-23 MED ORDER — ENOXAPARIN SODIUM 40 MG/0.4ML ~~LOC~~ SOLN: 40.0000 mg | SUBCUTANEOUS | Status: DC

## 2017-02-23 MED ORDER — PROPOFOL 10 MG/ML IV BOLUS
INTRAVENOUS | Status: DC | PRN
  Administered 2017-02-23: 150 mg via INTRAVENOUS

## 2017-02-23 MED ORDER — ROCURONIUM BROMIDE 100 MG/10ML IV SOLN
INTRAVENOUS | Status: DC | PRN
  Administered 2017-02-23: 50 mg via INTRAVENOUS

## 2017-02-23 MED ORDER — CIPROFLOXACIN IN D5W 400 MG/200ML IV SOLN
400.0000 mg | INTRAVENOUS | Status: AC
  Administered 2017-02-23: 400 mg via INTRAVENOUS
  Filled 2017-02-23: qty 200

## 2017-02-23 MED ORDER — GABAPENTIN 300 MG PO CAPS
300.0000 mg | ORAL_CAPSULE | ORAL | Status: AC
Start: 2017-02-23 — End: 2017-02-23
  Administered 2017-02-23: 300 mg via ORAL
  Filled 2017-02-23: qty 1

## 2017-02-23 MED ORDER — CHLORHEXIDINE GLUCONATE CLOTH 2 % EX PADS: 6.0000 | MEDICATED_PAD | Freq: Once | CUTANEOUS | Status: DC

## 2017-02-23 MED ORDER — HYDROMORPHONE HCL 1 MG/ML IJ SOLN
INTRAMUSCULAR | Status: AC
  Filled 2017-02-23: qty 0.5

## 2017-02-23 MED ORDER — MIDAZOLAM HCL 5 MG/5ML IJ SOLN
INTRAMUSCULAR | Status: DC | PRN
  Administered 2017-02-23: 2 mg via INTRAVENOUS

## 2017-02-23 MED ORDER — FENTANYL CITRATE (PF) 250 MCG/5ML IJ SOLN
INTRAMUSCULAR | Status: AC
  Filled 2017-02-23: qty 5

## 2017-02-23 MED ORDER — OXYCODONE HCL 5 MG/5ML PO SOLN: 5.0000 mg | Freq: Once | ORAL | Status: DC | PRN

## 2017-02-23 MED ORDER — SUGAMMADEX SODIUM 200 MG/2ML IV SOLN
INTRAVENOUS | Status: DC | PRN
  Administered 2017-02-23: 200 mg via INTRAVENOUS

## 2017-02-23 MED ORDER — SUGAMMADEX SODIUM 200 MG/2ML IV SOLN
INTRAVENOUS | Status: AC
  Filled 2017-02-23: qty 2

## 2017-02-23 MED ORDER — SODIUM CHLORIDE 0.9 % IR SOLN
Status: DC | PRN
  Administered 2017-02-23: 500 mL

## 2017-02-23 MED ORDER — LACTATED RINGERS IV SOLN
INTRAVENOUS | Status: DC | PRN
  Administered 2017-02-23: 07:00:00 via INTRAVENOUS

## 2017-02-23 MED ORDER — METHYLPHENIDATE HCL ER 20 MG PO TBCR: 20.0000 mg | EXTENDED_RELEASE_TABLET | Freq: Two times a day (BID) | ORAL | Status: DC | PRN

## 2017-02-23 MED ORDER — ROCURONIUM BROMIDE 10 MG/ML (PF) SYRINGE
PREFILLED_SYRINGE | INTRAVENOUS | Status: AC
  Filled 2017-02-23: qty 5

## 2017-02-23 MED ORDER — SODIUM CHLORIDE 0.9 % IV SOLN
INTRAVENOUS | Status: DC
  Administered 2017-02-23: 11:00:00 via INTRAVENOUS

## 2017-02-23 MED ORDER — HYDROMORPHONE HCL 1 MG/ML IJ SOLN
0.2500 mg | INTRAMUSCULAR | Status: DC | PRN
  Administered 2017-02-23 (×2): 0.5 mg via INTRAVENOUS

## 2017-02-23 MED ORDER — MEPERIDINE HCL 25 MG/ML IJ SOLN: 6.2500 mg | INTRAMUSCULAR | Status: DC | PRN

## 2017-02-23 MED ORDER — ONDANSETRON HCL 4 MG/2ML IJ SOLN
INTRAMUSCULAR | Status: AC
  Filled 2017-02-23: qty 2

## 2017-02-23 MED ORDER — ONDANSETRON HCL 4 MG/2ML IJ SOLN
INTRAMUSCULAR | Status: DC | PRN
  Administered 2017-02-23: 4 mg via INTRAVENOUS

## 2017-02-23 MED ORDER — ATORVASTATIN CALCIUM 40 MG PO TABS
40.0000 mg | ORAL_TABLET | Freq: Every day | ORAL | Status: DC
  Administered 2017-02-23: 40 mg via ORAL
  Filled 2017-02-23: qty 1

## 2017-02-23 MED ORDER — FENTANYL CITRATE (PF) 100 MCG/2ML IJ SOLN
INTRAMUSCULAR | Status: DC | PRN
  Administered 2017-02-23 (×2): 50 ug via INTRAVENOUS

## 2017-02-23 MED ORDER — EPHEDRINE SULFATE 50 MG/ML IJ SOLN
INTRAMUSCULAR | Status: DC | PRN
  Administered 2017-02-23 (×4): 5 mg via INTRAVENOUS

## 2017-02-23 MED ORDER — HYDROMORPHONE HCL 1 MG/ML IJ SOLN
0.5000 mg | INTRAMUSCULAR | Status: DC | PRN
  Administered 2017-02-23: 1 mg via INTRAVENOUS
  Filled 2017-02-23 (×2): qty 1

## 2017-02-23 MED ORDER — ATENOLOL 50 MG PO TABS
50.0000 mg | ORAL_TABLET | Freq: Once | ORAL | Status: AC
  Administered 2017-02-23: 50 mg via ORAL
  Filled 2017-02-23: qty 1

## 2017-02-23 MED ORDER — EPHEDRINE 5 MG/ML INJ
INTRAVENOUS | Status: AC
  Filled 2017-02-23: qty 10

## 2017-02-23 MED ORDER — LIDOCAINE 2% (20 MG/ML) 5 ML SYRINGE
INTRAMUSCULAR | Status: AC
  Filled 2017-02-23: qty 5

## 2017-02-23 MED ORDER — OXYCODONE HCL 5 MG PO TABS
5.0000 mg | ORAL_TABLET | ORAL | Status: DC | PRN
  Administered 2017-02-23 (×2): 5 mg via ORAL
  Administered 2017-02-24 (×3): 10 mg via ORAL
  Filled 2017-02-23: qty 1
  Filled 2017-02-23 (×3): qty 2
  Filled 2017-02-23: qty 1

## 2017-02-23 MED ORDER — PROMETHAZINE HCL 25 MG/ML IJ SOLN: 6.2500 mg | INTRAMUSCULAR | Status: DC | PRN

## 2017-02-23 MED ORDER — ONDANSETRON HCL 4 MG/2ML IJ SOLN: 4.0000 mg | Freq: Four times a day (QID) | INTRAMUSCULAR | Status: DC | PRN

## 2017-02-23 MED ORDER — ADULT MULTIVITAMIN W/MINERALS CH
1.0000 | ORAL_TABLET | Freq: Every day | ORAL | Status: DC
  Administered 2017-02-23 – 2017-02-24 (×2): 1 via ORAL
  Filled 2017-02-23 (×2): qty 1

## 2017-02-23 MED ORDER — PROPOFOL 10 MG/ML IV BOLUS
INTRAVENOUS | Status: AC
  Filled 2017-02-23: qty 40

## 2017-02-23 MED ORDER — OXYCODONE HCL 5 MG PO TABS: 5.0000 mg | ORAL_TABLET | Freq: Once | ORAL | Status: DC | PRN

## 2017-02-23 MED ORDER — ACETAMINOPHEN 500 MG PO TABS
1000.0000 mg | ORAL_TABLET | Freq: Three times a day (TID) | ORAL | Status: DC
  Administered 2017-02-23 (×2): 1000 mg via ORAL
  Filled 2017-02-23 (×3): qty 2

## 2017-02-23 MED ORDER — ONDANSETRON 4 MG PO TBDP: 4.0000 mg | ORAL_TABLET | Freq: Four times a day (QID) | ORAL | Status: DC | PRN

## 2017-02-23 MED ORDER — BUPIVACAINE HCL (PF) 0.25 % IJ SOLN
INTRAMUSCULAR | Status: DC | PRN
  Administered 2017-02-23: 10 mL

## 2017-02-23 MED ORDER — CIPROFLOXACIN IN D5W 400 MG/200ML IV SOLN
400.0000 mg | Freq: Two times a day (BID) | INTRAVENOUS | Status: AC
  Administered 2017-02-23: 400 mg via INTRAVENOUS
  Filled 2017-02-23: qty 200

## 2017-02-23 MED ORDER — 0.9 % SODIUM CHLORIDE (POUR BTL) OPTIME
TOPICAL | Status: DC | PRN
  Administered 2017-02-23: 1000 mL

## 2017-02-23 MED ORDER — LIDOCAINE HCL (CARDIAC) 20 MG/ML IV SOLN
INTRAVENOUS | Status: DC | PRN
  Administered 2017-02-23: 80 mg via INTRAVENOUS

## 2017-02-23 MED ORDER — ATENOLOL 50 MG PO TABS
50.0000 mg | ORAL_TABLET | Freq: Every day | ORAL | Status: DC
  Administered 2017-02-24: 50 mg via ORAL
  Filled 2017-02-23: qty 1

## 2017-02-23 MED ORDER — ACETAMINOPHEN 500 MG PO TABS
1000.0000 mg | ORAL_TABLET | ORAL | Status: AC
  Administered 2017-02-23: 1000 mg via ORAL
  Filled 2017-02-23: qty 2

## 2017-02-23 MED ORDER — BUPIVACAINE HCL (PF) 0.25 % IJ SOLN
INTRAMUSCULAR | Status: AC
  Filled 2017-02-23: qty 30

## 2017-02-23 MED ORDER — MIDAZOLAM HCL 2 MG/2ML IJ SOLN
INTRAMUSCULAR | Status: AC
  Filled 2017-02-23: qty 2

## 2017-02-23 SURGICAL SUPPLY — 48 items
ADH SKN CLS APL DERMABOND .7 (GAUZE/BANDAGES/DRESSINGS) ×1
BAG DECANTER FOR FLEXI CONT (MISCELLANEOUS) ×3 IMPLANT
BLADE CLIPPER SURG (BLADE) ×2 IMPLANT
BLADE SURG 10 STRL SS (BLADE) ×4 IMPLANT
CANISTER SUCT 3000ML PPV (MISCELLANEOUS) ×2 IMPLANT
CHLORAPREP W/TINT 26ML (MISCELLANEOUS) ×3 IMPLANT
CLEANER TIP ELECTROSURG 2X2 (MISCELLANEOUS) ×3 IMPLANT
CLOSURE STERI-STRIP 1/2X4 (GAUZE/BANDAGES/DRESSINGS) ×1
CLOSURE WOUND 1/2 X4 (GAUZE/BANDAGES/DRESSINGS) ×1
CLSR STERI-STRIP ANTIMIC 1/2X4 (GAUZE/BANDAGES/DRESSINGS) ×1 IMPLANT
COVER SURGICAL LIGHT HANDLE (MISCELLANEOUS) ×3 IMPLANT
DECANTER SPIKE VIAL GLASS SM (MISCELLANEOUS) ×2 IMPLANT
DERMABOND ADVANCED (GAUZE/BANDAGES/DRESSINGS) ×2
DERMABOND ADVANCED .7 DNX12 (GAUZE/BANDAGES/DRESSINGS) ×1 IMPLANT
DRAPE LAPAROTOMY TRNSV 102X78 (DRAPE) ×3 IMPLANT
DRAPE UTILITY XL STRL (DRAPES) ×4 IMPLANT
DRSG TEGADERM 4X4.75 (GAUZE/BANDAGES/DRESSINGS) ×2 IMPLANT
ELECT REM PT RETURN 9FT ADLT (ELECTROSURGICAL) ×3
ELECTRODE REM PT RTRN 9FT ADLT (ELECTROSURGICAL) ×1 IMPLANT
GLOVE BIOGEL PI IND STRL 8 (GLOVE) ×1 IMPLANT
GLOVE BIOGEL PI INDICATOR 8 (GLOVE) ×2
GLOVE ECLIPSE 7.5 STRL STRAW (GLOVE) ×3 IMPLANT
GOWN STRL REUS W/ TWL LRG LVL3 (GOWN DISPOSABLE) ×2 IMPLANT
GOWN STRL REUS W/TWL LRG LVL3 (GOWN DISPOSABLE) ×6
KIT BASIN OR (CUSTOM PROCEDURE TRAY) ×3 IMPLANT
KIT ROOM TURNOVER OR (KITS) ×3 IMPLANT
MESH VENTRALEX ST 1-7/10 CRC S (Mesh General) ×2 IMPLANT
NDL HYPO 25GX1X1/2 BEV (NEEDLE) ×1 IMPLANT
NEEDLE HYPO 25GX1X1/2 BEV (NEEDLE) ×3 IMPLANT
NS IRRIG 1000ML POUR BTL (IV SOLUTION) ×3 IMPLANT
PACK SURGICAL SETUP 50X90 (CUSTOM PROCEDURE TRAY) ×3 IMPLANT
PAD ARMBOARD 7.5X6 YLW CONV (MISCELLANEOUS) ×3 IMPLANT
PENCIL BUTTON HOLSTER BLD 10FT (ELECTRODE) ×3 IMPLANT
SPONGE INTESTINAL PEANUT (DISPOSABLE) ×3 IMPLANT
SPONGE LAP 18X18 X RAY DECT (DISPOSABLE) ×3 IMPLANT
STRIP CLOSURE SKIN 1/2X4 (GAUZE/BANDAGES/DRESSINGS) ×2 IMPLANT
SUT MNCRL AB 4-0 PS2 18 (SUTURE) ×3 IMPLANT
SUT NOVA NAB GS-21 0 18 T12 DT (SUTURE) ×4 IMPLANT
SUT VIC AB 3-0 SH 27 (SUTURE) ×3
SUT VIC AB 3-0 SH 27X BRD (SUTURE) ×1 IMPLANT
SUT VICRYL AB 3 0 TIES (SUTURE) ×3 IMPLANT
SYR BULB 3OZ (MISCELLANEOUS) ×3 IMPLANT
SYR CONTROL 10ML LL (SYRINGE) ×3 IMPLANT
TOWEL OR 17X24 6PK STRL BLUE (TOWEL DISPOSABLE) ×3 IMPLANT
TOWEL OR 17X26 10 PK STRL BLUE (TOWEL DISPOSABLE) ×3 IMPLANT
TUBE CONNECTING 12'X1/4 (SUCTIONS)
TUBE CONNECTING 12X1/4 (SUCTIONS) IMPLANT
YANKAUER SUCT BULB TIP NO VENT (SUCTIONS) IMPLANT

## 2017-02-23 NOTE — Anesthesia Procedure Notes (Signed)
Procedure Name: Intubation Date/Time: 02/23/2017 7:36 AM Performed by: Candis Shine Pre-anesthesia Checklist: Patient identified, Emergency Drugs available, Suction available and Patient being monitored Patient Re-evaluated:Patient Re-evaluated prior to inductionOxygen Delivery Method: Circle System Utilized Preoxygenation: Pre-oxygenation with 100% oxygen Intubation Type: IV induction Ventilation: Mask ventilation without difficulty Laryngoscope Size: Mac and 4 Grade View: Grade I Tube type: Oral Tube size: 7.5 mm Number of attempts: 1 Airway Equipment and Method: Stylet Placement Confirmation: ETT inserted through vocal cords under direct vision,  positive ETCO2 and breath sounds checked- equal and bilateral Secured at: 23 cm Tube secured with: Tape Dental Injury: Teeth and Oropharynx as per pre-operative assessment

## 2017-02-23 NOTE — H&P (Signed)
Aaron Humphrey is an 66 y.o. male.   Chief Complaint: Periumbilical pain associated with hernia HPI: Has had multiple ED visits for partly incarcerated umbilical hernia which has be reducible with some difficulty.  Now coming in for repair openly.  Past Medical History:  Diagnosis Date  . Abdominal distension   . Abdominal pain   . CAD (coronary artery disease), native coronary artery 2002   severe 3 vessel s/p CABG with LIMA to LAD, RIMA to RCA and SVG to diag. // Myoview 5/18: EF 55, inf/inf-septal defect c/w small region of ischemia vs soft tissue atten; Low Risk  . Hypertension, essential, benign   . Narcolepsy   . Other and unspecified hyperlipidemia   . Pre-diabetes   . RBBB 08/23/2015  . Sciatica   . Umbilical hernia   . Unspecified sleep apnea    cpap setting of 17    Past Surgical History:  Procedure Laterality Date  . COLONOSCOPY W/ BIOPSIES AND POLYPECTOMY    . CORONARY ARTERY BYPASS GRAFT     4V 2002  . ESOPHAGEAL DILATION     x 3  . HERNIA REPAIR     bilateral inguinal x 2  . MULTIPLE TOOTH EXTRACTIONS    . NASAL SINUS SURGERY  1978  . TONSILLECTOMY      Family History  Problem Relation Age of Onset  . Stroke Mother    Social History:  reports that he has never smoked. He has never used smokeless tobacco. He reports that he does not drink alcohol or use drugs.  Allergies:  Allergies  Allergen Reactions  . Zithromax [Azithromycin] Hives and Shortness Of Breath  . Ampicillin Hives    Has patient had a PCN reaction causing immediate rash, facial/tongue/throat swelling, SOB or lightheadedness with hypotension: Yes Has patient had a PCN reaction causing severe rash involving mucus membranes or skin necrosis: Unknown Has patient had a PCN reaction that required hospitalization: No Has patient had a PCN reaction occurring within the last 10 years: Yes If all of the above answers are "NO", then may proceed with Cephalosporin use.     Medications Prior to  Admission  Medication Sig Dispense Refill  . aspirin EC 81 MG tablet Take 1 tablet (81 mg total) by mouth daily. 30 tablet 0  . atenolol (TENORMIN) 50 MG tablet Take 50 mg by mouth daily.    Marland Kitchen atorvastatin (LIPITOR) 40 MG tablet Take 40 mg by mouth daily at 6 PM.     . methylphenidate (METADATE ER) 20 MG ER tablet Take 1-4 tablets daily as needed (Patient taking differently: Take 20 mg by mouth 2 (two) times daily as needed. ) 120 tablet 0  . Multiple Vitamin (MULTIVITAMIN) tablet Take 1 tablet by mouth daily.      . Multiple Vitamins-Minerals (ZINC PO) Take 1 tablet by mouth daily.    . Naproxen Sodium (ALEVE) 220 MG CAPS Take 220 mg by mouth daily as needed (pain).       Results for orders placed or performed during the hospital encounter of 02/23/17 (from the past 48 hour(s))  Glucose, capillary     Status: None   Collection Time: 02/23/17  7:01 AM  Result Value Ref Range   Glucose-Capillary 98 65 - 99 mg/dL   No results found.  Review of Systems  Constitutional: Negative for chills and fever.  Gastrointestinal: Positive for abdominal pain (periumbilical).  All other systems reviewed and are negative.   Blood pressure (!) 160/80, pulse (!) 58,  temperature 98.2 F (36.8 C), temperature source Oral, resp. rate 20, SpO2 98 %. Physical Exam  Constitutional: He is oriented to person, place, and time. He appears well-developed and well-nourished.  HENT:  Head: Normocephalic and atraumatic.  Neck: Normal range of motion. Neck supple.  Cardiovascular: Normal rate, regular rhythm and normal heart sounds.   Respiratory: Effort normal and breath sounds normal.  GI: Soft. Normal appearance and bowel sounds are normal. There is tenderness (periumbilical). A hernia is present. Hernia confirmed positive in the ventral area (palpable mostly supraumbilical).  Reducible umbilical hernia  Musculoskeletal: Normal range of motion.  Neurological: He is alert and oriented to person, place, and time.  He has normal reflexes.  Skin: Skin is warm and dry.  Psychiatric: He has a normal mood and affect. His behavior is normal. Judgment and thought content normal.     Assessment/Plan Umbilical hernia to be repaired open with mesh. Over night stay because of history of CAD and sleep apnea.  Aaron Horn, MD 02/23/2017, 7:12 AM

## 2017-02-23 NOTE — Progress Notes (Signed)
Arrived to 6n18 from PACU at this time. Denies pain, oriented to room and surroundings, wife at bedside

## 2017-02-23 NOTE — Anesthesia Preprocedure Evaluation (Addendum)
Anesthesia Evaluation  Patient identified by MRN, date of birth, ID band Patient awake    Reviewed: Allergy & Precautions, NPO status , Patient's Chart, lab work & pertinent test results, reviewed documented beta blocker date and time   Airway Mallampati: II  TM Distance: >3 FB Neck ROM: Full    Dental no notable dental hx.    Pulmonary neg pulmonary ROS, sleep apnea ,    Pulmonary exam normal breath sounds clear to auscultation       Cardiovascular hypertension, Pt. on home beta blockers + CAD  negative cardio ROS Normal cardiovascular exam Rhythm:Regular Rate:Normal     Neuro/Psych negative neurological ROS  negative psych ROS   GI/Hepatic negative GI ROS, Neg liver ROS,   Endo/Other  negative endocrine ROS  Renal/GU negative Renal ROS  negative genitourinary   Musculoskeletal negative musculoskeletal ROS (+)   Abdominal   Peds negative pediatric ROS (+)  Hematology negative hematology ROS (+)   Anesthesia Other Findings   Reproductive/Obstetrics negative OB ROS                            Anesthesia Physical Anesthesia Plan  ASA: III  Anesthesia Plan: General   Post-op Pain Management:    Induction: Intravenous  Airway Management Planned: Oral ETT  Additional Equipment:   Intra-op Plan:   Post-operative Plan: Extubation in OR  Informed Consent: I have reviewed the patients History and Physical, chart, labs and discussed the procedure including the risks, benefits and alternatives for the proposed anesthesia with the patient or authorized representative who has indicated his/her understanding and acceptance.   Dental advisory given  Plan Discussed with: CRNA  Anesthesia Plan Comments:         Anesthesia Quick Evaluation

## 2017-02-23 NOTE — Progress Notes (Signed)
Orthopedic Tech Progress Note Patient Details:  Aaron Humphrey 06/18/1951 [id number]  Ortho Devices Type of Ortho Device: Abdominal binder Ortho Device/Splint Location: abdomen Ortho Device/Splint Interventions: [name], [name] 02/23/2017, 9:35 AM

## 2017-02-23 NOTE — Op Note (Signed)
OPERATIVE REPORT  DATE OF OPERATION: 02/23/2017  PATIENT:  Aaron Humphrey  66 y.o. male  PRE-OPERATIVE DIAGNOSIS:  Umbilical hernia  POST-OPERATIVE DIAGNOSIS:  Umbilical hernia with incarcerated omentum  INDICATION(S) FOR OPERATION:  Repetitive incarceration of umbilical hernia  FINDINGS:  Small amount of omentum in umbilical hernia  PROCEDURE:  Procedure(s): UMBILICAL HERNIA REPAIR WITH MESH INSERTION OF MESH  SURGEON:  Surgeon(s): Judeth Horn, MD  ASSISTANT: None  ANESTHESIA:   general  COMPLICATIONS:  None  EBL: 20 ml  BLOOD ADMINISTERED: none  DRAINS: none   SPECIMEN:  No Specimen  COUNTS CORRECT:  YES  PROCEDURE DETAILS: The patient was taken to the operating room and placed on the table in the supine position. After an adequate general endotracheal anesthetic was administered, he was prepped and draped in usual sterile manner exposing the umbilical area.  A proper timeout was performed identifying the patient and procedure to be performed. We marked the area for the incision which measured approximately 6 cm long. It was in the supraumbilical area. The original incision was made using a #10 blade. We dissected down into the subcutaneous tissue using electrocautery and Metzenbaum scissors. We detached the hernia sac from the subcuticular fascia using Metzenbaum scissors and subsequently dissected out the hernia sac. We excised the hernia sac from the fascial edge using electrocautery and reduce the piece of omentum which was incarcerated in the sac.  The hernia defect itself was approximately 2 cm in size. We used a 4.2 cm circular piece of coated mesh to implanted in the wound in the subfascial position and tacked in place with 0 Novafil sutures. It a been soaked in antibiotic solution prior to being implanted. We then attached it to the mesh and also reapproximated fascia using interrupted 0 Novafil sutures. This covered the mesh into the subcutaneous tissue. We  irrigated with antibiotic solution then reapproximated the subcutaneous tissue using 3-0 Vicryl suture. 0.25% Marcaine without epinephrine was injected into the subcutaneous tissue. We reapproximated the skin using running subcuticular stitch of 4-0 Monocryl. Dermabond, Steri-Strips, and Tegaderm were used to complete the dressing. All counts were correct including needles sponges and instruments.  PATIENT DISPOSITION:  PACU - hemodynamically stable.   Aaron Humphrey 5/22/20189:01 AM

## 2017-02-23 NOTE — Progress Notes (Signed)
Pt BP 160/80, HR 56-59. Pt states his HR is usually in 62s. Pt normally takes atenolol 50mg  in AM, has not had today. Spoke with Dr. Sabra Heck and ok to give atenolol prior to surgery today.

## 2017-02-23 NOTE — Transfer of Care (Signed)
Immediate Anesthesia Transfer of Care Note  Patient: Aaron Humphrey  Procedure(s) Performed: Procedure(s): UMBILICAL HERNIA REPAIR WITH MESH (N/A) INSERTION OF MESH (N/A)  Patient Location: PACU  Anesthesia Type:General  Level of Consciousness: awake, alert  and oriented  Airway & Oxygen Therapy: Patient Spontanous Breathing and Patient connected to nasal cannula oxygen  Post-op Assessment: Report given to RN and Post -op Vital signs reviewed and stable  Post vital signs: Reviewed and stable  Last Vitals:  Vitals:   02/23/17 0657 02/23/17 0902  BP: (!) 160/80   Pulse: (!) 58 (!) 59  Resp:  (!) 21  Temp:  36.5 C    Last Pain:  Vitals:   02/23/17 0650  TempSrc: Oral      Patients Stated Pain Goal: 3 (90/93/11 2162)  Complications: No apparent anesthesia complications

## 2017-02-23 NOTE — Anesthesia Postprocedure Evaluation (Signed)
Anesthesia Post Note  Patient: Aaron Humphrey  Procedure(s) Performed: Procedure(s) (LRB): UMBILICAL HERNIA REPAIR WITH MESH (N/A) INSERTION OF MESH (N/A)  Patient location during evaluation: PACU Anesthesia Type: General Level of consciousness: awake and alert Pain management: pain level controlled Vital Signs Assessment: post-procedure vital signs reviewed and stable Respiratory status: spontaneous breathing, nonlabored ventilation and respiratory function stable Cardiovascular status: blood pressure returned to baseline and stable Postop Assessment: no signs of nausea or vomiting Anesthetic complications: no       Last Vitals:  Vitals:   02/23/17 1000 02/23/17 1026  BP:    Pulse: (!) 50 (!) 50  Resp: 14 14  Temp:  36.7 C    Last Pain:  Vitals:   02/23/17 0947  TempSrc:   PainSc: [place]

## 2017-02-24 ENCOUNTER — Encounter (HOSPITAL_COMMUNITY): Payer: Self-pay | Admitting: General Surgery

## 2017-02-24 DIAGNOSIS — I2510 Atherosclerotic heart disease of native coronary artery without angina pectoris: Secondary | ICD-10-CM | POA: Diagnosis not present

## 2017-02-24 DIAGNOSIS — E785 Hyperlipidemia, unspecified: Secondary | ICD-10-CM | POA: Diagnosis not present

## 2017-02-24 DIAGNOSIS — K420 Umbilical hernia with obstruction, without gangrene: Secondary | ICD-10-CM | POA: Diagnosis not present

## 2017-02-24 DIAGNOSIS — G47419 Narcolepsy without cataplexy: Secondary | ICD-10-CM | POA: Diagnosis not present

## 2017-02-24 DIAGNOSIS — I10 Essential (primary) hypertension: Secondary | ICD-10-CM | POA: Diagnosis not present

## 2017-02-24 DIAGNOSIS — R7303 Prediabetes: Secondary | ICD-10-CM | POA: Diagnosis not present

## 2017-02-24 LAB — CBC
HEMATOCRIT: 41.5 % (ref 39.0–52.0)
Hemoglobin: 13.6 g/dL (ref 13.0–17.0)
MCH: 31.2 pg (ref 26.0–34.0)
MCHC: 32.8 g/dL (ref 30.0–36.0)
MCV: 95.2 fL (ref 78.0–100.0)
PLATELETS: 156 10*3/uL (ref 150–400)
RBC: 4.36 MIL/uL (ref 4.22–5.81)
RDW: 13.4 % (ref 11.5–15.5)
WBC: 7.7 10*3/uL (ref 4.0–10.5)

## 2017-02-24 MED ORDER — OXYCODONE HCL 5 MG PO TABS: 5.0000 mg | ORAL_TABLET | ORAL | 0 refills | Status: DC | PRN

## 2017-02-24 NOTE — Progress Notes (Signed)
Patient left [facility] for home accompanied by wife.  Discharge instructions and packet were given to patient earlier by day shift RN.

## 2017-02-24 NOTE — Progress Notes (Signed)
Patient discharged to home with instructions and prescription. 

## 2017-02-24 NOTE — Discharge Summary (Signed)
Physician Discharge Summary  Patient ID: Aaron Humphrey MRN: [id number] DOB/AGE: 67-Dec-1952 67 y.o.  Admit date: 02/23/2017 Discharge date: 02/24/2017  Admission Diagnoses:  Discharge Diagnoses:  Active Problems:   Umbilical hernia   Discharged Condition: good  Hospital Course: Admitted because of a history of sleep apnea after uncomplicated umbilical hernia repair with mesh.  Did well overnight.  Okay to go home today.  Consults: None  Significant Diagnostic Studies: None  Treatments: IV hydration, antibiotics: Ancef and surgery: Umbilical hernia repair with mesh  Discharge Exam: Blood pressure 124/68, pulse 92, temperature 98.2 F (36.8 C), resp. rate 18, height 6\' 1"  (1.854 m), weight 92.3 kg (203 lb 7.8 oz), SpO2 94 %. General appearance: alert, cooperative and no distress GI: soft, non-tender; bowel sounds normal; no masses,  no organomegaly and Wound is covered with plastic Tagaderm dressing without drainage underneath the dressing.  Disposition: 01-Home or Self Care  Discharge Instructions    Call MD for:  difficulty breathing, headache or visual disturbances    Complete by:  As directed    Call MD for:  extreme fatigue    Complete by:  As directed    Call MD for:  hives    Complete by:  As directed    Call MD for:  persistant dizziness or light-headedness    Complete by:  As directed    Call MD for:  persistant nausea and vomiting    Complete by:  As directed    Call MD for:  redness, tenderness, or signs of infection (pain, swelling, redness, odor or green/yellow discharge around incision site)    Complete by:  As directed    Call MD for:  severe uncontrolled pain    Complete by:  As directed    Call MD for:  temperature >100.4    Complete by:  As directed    Diet - low sodium heart healthy    Complete by:  As directed    Driving Restrictions    Complete by:  As directed    3-5 days or when not taking pain medications   Increase activity slowly     Complete by:  As directed    Leave dressing on - Keep it clean, dry, and intact until clinic visit    Complete by:  As directed    Lifting restrictions    Complete by:  As directed    No lifting greater than 20 pounds for the next three weeks     Allergies as of 02/24/2017      Reactions   Zithromax [azithromycin] Hives, Shortness Of Breath   Ampicillin Hives   Has patient had a PCN reaction causing immediate rash, facial/tongue/throat swelling, SOB or lightheadedness with hypotension: Yes Has patient had a PCN reaction causing severe rash involving mucus membranes or skin necrosis: Unknown Has patient had a PCN reaction that required hospitalization: No Has patient had a PCN reaction occurring within the last 10 years: Yes If all of the above answers are "NO", then may proceed with Cephalosporin use.      Medication List    TAKE these medications   ALEVE 220 MG Caps Generic drug:  Naproxen Sodium Take 220 mg by mouth daily as needed (pain).   aspirin EC 81 MG tablet Take 1 tablet (81 mg total) by mouth daily.   atenolol 50 MG tablet Commonly known as:  TENORMIN Take 50 mg by mouth daily.   atorvastatin 40 MG tablet Commonly known as:  LIPITOR Take  40 mg by mouth daily at 6 PM.   methylphenidate 20 MG ER tablet Commonly known as:  METADATE ER Take 1-4 tablets daily as needed What changed:  how much to take  how to take this  when to take this  reasons to take this  additional instructions   multivitamin tablet Take 1 tablet by mouth daily.   oxyCODONE 5 MG immediate release tablet Commonly known as:  Oxy IR/ROXICODONE Take 1-2 tablets (5-10 mg total) by mouth every 4 (four) hours as needed for moderate pain.   ZINC PO Take 1 tablet by mouth daily.      Follow-up Information    Judeth Horn, MD. Schedule an appointment as soon as possible for a visit in 2 day(s).   Specialty:  General Surgery Contact information: [address] [place]  [place] [address] [phone]           Signed: Judeth Horn 02/24/2017, 7:28 AM

## 2017-02-25 ENCOUNTER — Ambulatory Visit: Payer: Self-pay | Admitting: Physician Assistant

## 2017-02-26 ENCOUNTER — Encounter (HOSPITAL_COMMUNITY): Payer: Self-pay | Admitting: General Surgery

## 2017-03-05 ENCOUNTER — Telehealth: Payer: Self-pay | Admitting: Internal Medicine

## 2017-03-05 MED ORDER — METHYLPHENIDATE HCL ER 20 MG PO TBCR: EXTENDED_RELEASE_TABLET | ORAL | 0 refills | Status: DC

## 2017-03-05 NOTE — Telephone Encounter (Signed)
rx has been printed out and signed by CY.  I have placed this rx up front and I have called and lmom to make the pt aware that the rx is ready to be picked up.

## 2017-03-10 ENCOUNTER — Ambulatory Visit (INDEPENDENT_AMBULATORY_CARE_PROVIDER_SITE_OTHER): Payer: Medicare Other | Admitting: Family Medicine

## 2017-03-10 ENCOUNTER — Encounter: Payer: Self-pay | Admitting: Family Medicine

## 2017-03-10 VITALS — BP 127/80 | HR 52 | Temp 98.6°F | Ht 73.0 in | Wt 205.0 lb

## 2017-03-10 DIAGNOSIS — G4711 Idiopathic hypersomnia with long sleep time: Secondary | ICD-10-CM | POA: Diagnosis not present

## 2017-03-10 DIAGNOSIS — I10 Essential (primary) hypertension: Secondary | ICD-10-CM

## 2017-03-10 DIAGNOSIS — I2510 Atherosclerotic heart disease of native coronary artery without angina pectoris: Secondary | ICD-10-CM

## 2017-03-10 DIAGNOSIS — E782 Mixed hyperlipidemia: Secondary | ICD-10-CM

## 2017-03-10 DIAGNOSIS — G4733 Obstructive sleep apnea (adult) (pediatric): Secondary | ICD-10-CM | POA: Diagnosis not present

## 2017-03-10 NOTE — Patient Instructions (Signed)
WE NOW OFFER   [facility] FAST TRACK!!!  SAME DAY Appointments for ACUTE CARE  Such as: Sprains, Injuries, cuts, abrasions, rashes, muscle pain, joint pain, back pain Colds, flu, sore throats, headache, allergies, cough, fever  Ear pain, sinus and eye infections Abdominal pain, nausea, vomiting, diarrhea, upset stomach Animal/insect bites  3 Easy Ways to Schedule: Walk-In Scheduling Call in scheduling Mychart Sign-up: [email or web address]         

## 2017-03-10 NOTE — Progress Notes (Signed)
   Subjective:    Patient ID: Aaron Humphrey, male    DOB: [birth year], 66 y.o.   MRN: [id number]  HPI 66 yr old male to establish with [place] after transfering from Dr. Antony Contras. He feels fine today and has no complaints. He just had surgery to repair an umbilical hernia a few weeks ago, and he is recovering well. He is out of work for a month or so for this. His HTN is stable. He has had elevated glucoses in the past few years, but his A1c on 02-10-17 was excellent at 5.9. He does not know when his lipids were checked, and I have no results of this in his chart. He has a hx of CABG with 4 native vessel grafts and he is doing well. He sees Dr. Fransico Him for this.    Review of Systems  Constitutional: Negative.   Respiratory: Negative.   Cardiovascular: Negative.   Gastrointestinal: Negative.   Neurological: Negative.        Objective:   Physical Exam  Constitutional: He is oriented to person, place, and time. He appears well-developed and well-nourished.  Neck: No thyromegaly present.  Cardiovascular: Normal rate, regular rhythm, normal heart sounds and intact distal pulses.   Pulmonary/Chest: Effort normal and breath sounds normal. No respiratory distress. He has no wheezes. He has no rales.  Abdominal: Soft. Bowel sounds are normal. He exhibits no distension and no mass. There is no tenderness. There is no rebound and no guarding.  Musculoskeletal: He exhibits no edema.  Lymphadenopathy:    He has no cervical adenopathy.  Neurological: He is alert and oriented to person, place, and time.          Assessment & Plan:  Intro visit with this patient who recently had an umbilical hernia repair. His HTN amnd prediabetes are stable. We will get past records to see when his last lipid panel was checked.  Alysia Penna, MD

## 2017-03-19 ENCOUNTER — Encounter: Payer: Self-pay | Admitting: Family Medicine

## 2017-04-08 ENCOUNTER — Telehealth: Payer: Self-pay | Admitting: Internal Medicine

## 2017-04-08 NOTE — Telephone Encounter (Signed)
I have called and lmomtcb x 1 for the pt.  The [employer] forms have been faxed back and I have made a copy for his records and need to know if he wants the originals mailed to him or does he want to come and pick these up.

## 2017-04-08 NOTE — Telephone Encounter (Signed)
Pt returned call and I let him know that forms had been faxed to [place] and a copy was mailed to him, and he was satified.Hillery Hunter

## 2017-04-08 NOTE — Telephone Encounter (Signed)
[employer] forms have been completed. Forms have been placed on CY's cart to be signed. Will route message to [facility] for follow up.

## 2017-04-19 ENCOUNTER — Encounter: Payer: Self-pay | Admitting: Family Medicine

## 2017-05-05 ENCOUNTER — Telehealth: Payer: Self-pay | Admitting: Internal Medicine

## 2017-05-05 MED ORDER — METHYLPHENIDATE HCL ER 20 MG PO TBCR: EXTENDED_RELEASE_TABLET | ORAL | 0 refills | Status: DC

## 2017-05-05 NOTE — Telephone Encounter (Signed)
Pt returning call and can be reached [phone] .Hillery Hunter

## 2017-05-05 NOTE — Telephone Encounter (Signed)
Ok to refill 

## 2017-05-05 NOTE — Telephone Encounter (Signed)
CY has an opening this Friday, pt scheduled at this opening.    Pt also requesting a refill on Metadate 20mg . Last refill: 03/05/17 #120 with 0 refills, take 1-4 tabs prn.   Pt requesting to pick this up.  CY please advise if ok to refill.  Thanks!

## 2017-05-05 NOTE — Telephone Encounter (Signed)
Spoke with pt, aware of rx refill.  rx printed and left on CY's cart for signature.  Will route to KW to ensure that this is left up front for pickup after signature.

## 2017-05-05 NOTE — Telephone Encounter (Signed)
atc pt, no answer, no vm.  Wcb.  

## 2017-05-06 NOTE — Telephone Encounter (Signed)
Rx returned to triage.

## 2017-05-06 NOTE — Telephone Encounter (Signed)
Rx has been placed up front for pick up. Attempted to contact pt. No answer, no option to leave a message. Will try back.

## 2017-05-07 ENCOUNTER — Encounter: Payer: Self-pay | Admitting: Internal Medicine

## 2017-05-07 ENCOUNTER — Ambulatory Visit (INDEPENDENT_AMBULATORY_CARE_PROVIDER_SITE_OTHER): Payer: Medicare Other | Admitting: Internal Medicine

## 2017-05-07 VITALS — BP 120/74 | HR 57 | Ht 72.0 in | Wt 214.4 lb

## 2017-05-07 DIAGNOSIS — I2510 Atherosclerotic heart disease of native coronary artery without angina pectoris: Secondary | ICD-10-CM | POA: Diagnosis not present

## 2017-05-07 DIAGNOSIS — G47411 Narcolepsy with cataplexy: Secondary | ICD-10-CM

## 2017-05-07 DIAGNOSIS — G4733 Obstructive sleep apnea (adult) (pediatric): Secondary | ICD-10-CM

## 2017-05-07 NOTE — Telephone Encounter (Signed)
Called and lmom to make him aware of rx that is ready to be picked up.

## 2017-05-07 NOTE — Assessment & Plan Note (Signed)
He has lost the download card from his machine. His machine is now quite old and eligible for replacement. Plan-replace old CPAP machine and changed to AutoPap 5-20 with Airview. We think he is continuing to benefit from CPAP and want to make sure he is getting maximal therapeutic support so residual OSA is not contributing to his daytime sleepiness.

## 2017-05-07 NOTE — Progress Notes (Signed)
HPI M never smoker followed for OSA, narcolepsy/cataplexy, complicated by HBP, allergic rhinitis, CAD/CABG NPSG 05/14/2005-AHI 44/hour, desaturation to 80% MSLT 06/23/2010- mean latency 1.4 minutes, SOREM 0  ------------------------------------------------------------------------------------------------------  09/28/11- 60 yoM followed for OSA, complicated by HBP, allergic rhinitis, CAD/CABG LOV- 03/11/10 wife is here He continues fully compliant with CPAP at 13 to denies having problems with the machine or mask. Occasional residual daytime sleepiness is addressed with sustained action Ritalin, mainly if he needs to drive or especially tired in the afternoon. He averages one or 2 tablets on most days. This has been well tolerated with no palpitation, chest pain or mood change. Provigil was tried, but expensive.  -------------------------------------------------------------------- 01/14/15- 63 yoM followed for OSA with hypersomnia, complicated by HBP, allergic rhinitis, CAD/CABG FOLLOWS FOR: Wears CPAP 13/  every night through [facility]. Wife says he is too sleepy in the daytime "fades away". Ritalin and morning coffee don't seem to make enough difference. He says he just works hard and is tired when he gets home but admits being concerned about his driving on the way home. Denies cataplexy. No active heart symptoms.  05/07/17- 65 yoM never smoker followed for OSA with narcolepsy/cataplexy, complicated by HBP, allergic rhinitis, CAD/CAB CPAP 13/Advanced today> new cpap auto 5-20 FOLLOWS FOR: DME [facility]. Pt wears CPAP nightly and feels pressure needs to be adjusted. ? time for new machine. No SD card in CPAP and not in Airview Metadate 20 ER mg 1-4 tabs daily as needed- usually takes between 2 and 3 tablets daily. Wife says if she startles him he is apt to get limp and fall so she has learned to talk as she comes up behind him. This suggests cataplexy which is more evident now than years ago. Still very tired  through much of the day. He tries to maintain good sleep habits and naps.  ROS-see HPI  + = pos Constitutional:   No-   weight loss, night sweats, fevers, chills, +fatigue, lassitude. HEENT:   No-  headaches, difficulty swallowing, tooth/dental problems, sore throat,       No-  sneezing, itching, ear ache, nasal congestion, post nasal drip,  CV:  No-   chest pain, orthopnea, PND, swelling in lower extremities, anasarca, dizziness, palpitations Resp: No-   shortness of breath with exertion or at rest.              No-   productive cough,  No non-productive cough,  No- coughing up of blood.              No-   change in color of mucus.  No- wheezing.   Skin: No-   rash or lesions. GI:  No-   heartburn, indigestion, abdominal pain, nausea, vomiting,                 change in bowel habits, loss of appetite GU: MS:  Neuro-     nothing unusual Psych:  No- change in mood or affect. No depression or anxiety.  No memory loss.  OBJ General- Alert/ laconic, Oriented, Affect-appropriate, Distress- none acute. Trim Skin- rash-none, lesions- none, excoriation- none Lymphadenopathy- none Head- atraumatic            Eyes- Gross vision intact, PERRLA, conjunctivae clear secretions            Ears- Hearing, canals-normal            Nose- Clear, no-Septal dev, mucus, polyps, erosion, perforation  Throat- Mallampati III , mucosa clear , drainage- none, tonsils- atrophic Neck- flexible , trachea midline, no stridor , thyroid nl, carotid no bruit Chest - symmetrical excursion , unlabored           Heart/CV- RRR , no murmur , no gallop  , no rub, nl s1 s2                           - JVD- none , edema- none, stasis changes- none, varices- none           Lung- clear to P&A, wheeze- none, cough- none , dullness-none, rub- none           Chest wall-  Abd- Br/ Gen/ Rectal- Not done, not indicated Extrem- cyanosis- none, clubbing, none, atrophy- none, strength- nl Neuro- grossly intact to  observation

## 2017-05-07 NOTE — Assessment & Plan Note (Signed)
MS LT in 2011 had been nonspecific without REM associated with naps. 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 Medication use remains appropriate using Metadate ER long-term, with no abuse or concerns. Provigil has been too expensive. We continued emphasized naps, driving responsibility and good sleep hygiene.

## 2017-05-07 NOTE — Patient Instructions (Signed)
Order- Patient would like to change DME to [facility]- replacement for old CPAP machine- change to auto 5-20, mask of choice, humidifier, supplies, AirView     Dx OSA  Ok to continue Metadate for narcolepsy symptoms as discussed  Always take an opportunity when you can to take a nap. Sleep helps your brain more than the pills can.

## 2017-05-12 ENCOUNTER — Ambulatory Visit (INDEPENDENT_AMBULATORY_CARE_PROVIDER_SITE_OTHER): Payer: Medicare Other | Admitting: Family Medicine

## 2017-05-12 ENCOUNTER — Encounter: Payer: Self-pay | Admitting: Family Medicine

## 2017-05-12 VITALS — BP 125/88 | HR 57 | Temp 98.2°F | Ht 72.0 in | Wt 214.0 lb

## 2017-05-12 DIAGNOSIS — E782 Mixed hyperlipidemia: Secondary | ICD-10-CM

## 2017-05-12 DIAGNOSIS — N401 Enlarged prostate with lower urinary tract symptoms: Secondary | ICD-10-CM

## 2017-05-12 DIAGNOSIS — R739 Hyperglycemia, unspecified: Secondary | ICD-10-CM

## 2017-05-12 DIAGNOSIS — N138 Other obstructive and reflux uropathy: Secondary | ICD-10-CM | POA: Diagnosis not present

## 2017-05-12 DIAGNOSIS — I2510 Atherosclerotic heart disease of native coronary artery without angina pectoris: Secondary | ICD-10-CM

## 2017-05-12 DIAGNOSIS — I10 Essential (primary) hypertension: Secondary | ICD-10-CM

## 2017-05-12 DIAGNOSIS — G47411 Narcolepsy with cataplexy: Secondary | ICD-10-CM

## 2017-05-12 LAB — CBC WITH DIFFERENTIAL/PLATELET
BASOS PCT: 1 % (ref 0.0–3.0)
Basophils Absolute: 0 10*3/uL (ref 0.0–0.1)
EOS ABS: 0.3 10*3/uL (ref 0.0–0.7)
EOS PCT: 5.5 % — AB (ref 0.0–5.0)
HEMATOCRIT: 46.2 % (ref 39.0–52.0)
HEMOGLOBIN: 15.4 g/dL (ref 13.0–17.0)
Lymphocytes Relative: 19.7 % (ref 12.0–46.0)
Lymphs Abs: 1 10*3/uL (ref 0.7–4.0)
MCHC: 33.3 g/dL (ref 30.0–36.0)
MCV: 96.5 fl (ref 78.0–100.0)
MONO ABS: 0.6 10*3/uL (ref 0.1–1.0)
MONOS PCT: 11.4 % (ref 3.0–12.0)
Neutro Abs: 3.2 10*3/uL (ref 1.4–7.7)
Neutrophils Relative %: 62.4 % (ref 43.0–77.0)
Platelets: 184 10*3/uL (ref 150.0–400.0)
RBC: 4.78 Mil/uL (ref 4.22–5.81)
RDW: 14.1 % (ref 11.5–15.5)
WBC: 5.1 10*3/uL (ref 4.0–10.5)

## 2017-05-12 LAB — PSA: PSA: 0.93 ng/mL (ref 0.10–4.00)

## 2017-05-12 LAB — LIPID PANEL
CHOLESTEROL: 181 mg/dL (ref 0–200)
HDL: 50 mg/dL (ref >39.00)
LDL Cholesterol: 110 mg/dL — ABNORMAL HIGH (ref 0–99)
NONHDL: 131.02
Total CHOL/HDL Ratio: 4
Triglycerides: 104 mg/dL (ref 0.0–149.0)
VLDL: 20.8 mg/dL (ref 0.0–40.0)

## 2017-05-12 LAB — POC URINALSYSI DIPSTICK (AUTOMATED)
Bilirubin, UA: NEGATIVE
Blood, UA: NEGATIVE
Glucose, UA: NEGATIVE
KETONES UA: NEGATIVE
LEUKOCYTES UA: NEGATIVE
Nitrite, UA: NEGATIVE
PROTEIN UA: NEGATIVE
Urobilinogen, UA: 0.2 E.U./dL
pH, UA: 6 (ref 5.0–8.0)

## 2017-05-12 LAB — HEPATIC FUNCTION PANEL
ALK PHOS: 97 U/L (ref 39–117)
ALT: 13 U/L (ref 0–53)
AST: 17 U/L (ref 0–37)
Albumin: 4.2 g/dL (ref 3.5–5.2)
BILIRUBIN DIRECT: 0.1 mg/dL (ref 0.0–0.3)
BILIRUBIN TOTAL: 0.8 mg/dL (ref 0.2–1.2)
TOTAL PROTEIN: 7 g/dL (ref 6.0–8.3)

## 2017-05-12 LAB — HEMOGLOBIN A1C: HEMOGLOBIN A1C: 6 % (ref 4.6–6.5)

## 2017-05-12 LAB — BASIC METABOLIC PANEL
BUN: 18 mg/dL (ref 6–23)
CO2: 32 meq/L (ref 19–32)
Calcium: 9.3 mg/dL (ref 8.4–10.5)
Chloride: 104 mEq/L (ref 96–112)
Creatinine, Ser: 1.3 mg/dL (ref 0.40–1.50)
GFR: 58.53 mL/min — ABNORMAL LOW (ref >60.00)
GLUCOSE: 111 mg/dL — AB (ref 70–99)
Potassium: 4.3 mEq/L (ref 3.5–5.1)
SODIUM: 138 meq/L (ref 135–145)

## 2017-05-12 LAB — TSH: TSH: 2.01 u[IU]/mL (ref 0.35–4.50)

## 2017-05-12 MED ORDER — ATORVASTATIN CALCIUM 40 MG PO TABS: 40.0000 mg | ORAL_TABLET | Freq: Every day | ORAL | 3 refills | Status: AC

## 2017-05-12 MED ORDER — METOPROLOL SUCCINATE ER 50 MG PO TB24: 50.0000 mg | ORAL_TABLET | Freq: Every day | ORAL | 3 refills | Status: DC

## 2017-05-12 NOTE — Progress Notes (Signed)
   Subjective:    Patient ID: Aaron Humphrey, male    DOB: 10-19-1950, 66 y.o.   MRN: [id number]  HPI Here to follow up on several issues. He feels great. He has recovered from his recent umbilical hernia surgery. His BP at home is well controlled in the mornings, but the BP often jumps up to the 219X or 588T systolic in the evenings. He takes Atenolol once a day im the mornings. His narcolepsy is stable. He sleeps well at night. He quit his job at [employer] because it involved too much lifting, but he is looking for something else.    Review of Systems  Constitutional: Negative.   HENT: Negative.   Eyes: Negative.   Respiratory: Negative.   Cardiovascular: Negative.   Gastrointestinal: Negative.   Genitourinary: Negative.   Musculoskeletal: Negative.   Skin: Negative.   Neurological: Negative.   Psychiatric/Behavioral: Negative.        Objective:   Physical Exam  Constitutional: He is oriented to person, place, and time. He appears well-developed and well-nourished. No distress.  HENT:  Head: Normocephalic and atraumatic.  Right Ear: External ear normal.  Left Ear: External ear normal.  Nose: Nose normal.  Mouth/Throat: Oropharynx is clear and moist. No oropharyngeal exudate.  Eyes: Pupils are equal, round, and reactive to light. Conjunctivae and EOM are normal. Right eye exhibits no discharge. Left eye exhibits no discharge. No scleral icterus.  Neck: Neck supple. No JVD present. No tracheal deviation present. No thyromegaly present.  Cardiovascular: Normal rate, regular rhythm, normal heart sounds and intact distal pulses.  Exam reveals no gallop and no friction rub.   No murmur heard. Pulmonary/Chest: Effort normal and breath sounds normal. No respiratory distress. He has no wheezes. He has no rales. He exhibits no tenderness.  Abdominal: Soft. Bowel sounds are normal. He exhibits no distension and no mass. There is no tenderness. There is no rebound and no guarding.    Genitourinary: Rectum normal, prostate normal and penis normal. Rectal exam shows guaiac negative stool. No penile tenderness.  Musculoskeletal: Normal range of motion. He exhibits no edema or tenderness.  Lymphadenopathy:    He has no cervical adenopathy.  Neurological: He is alert and oriented to person, place, and time. He has normal reflexes. No cranial nerve deficit. He exhibits normal muscle tone. Coordination normal.  Skin: Skin is warm and dry. No rash noted. He is not diaphoretic. No erythema. No pallor.  Psychiatric: He has a normal mood and affect. His behavior is normal. Judgment and thought content normal.          Assessment & Plan:  For his HTN we will stop the Atenolol and switch to Metoprolol succinate 50 mg daily, and this should provide more 24 hour control. They will report back in a few weeks with how he is doing. We will get fasting labs today to check his lipids and an A1c, among other things. He will follow up with Dr. Radford Pax for cardiologic care and wioth Dr. Annamaria Boots for his narcolepsy and sleep apnea.  Alysia Penna, MD

## 2017-05-12 NOTE — Patient Instructions (Signed)
WE NOW OFFER    [facility] FAST TRACK!!!  SAME DAY Appointments for ACUTE CARE  Such as: Sprains, Injuries, cuts, abrasions, rashes, muscle pain, joint pain, back pain Colds, flu, sore throats, headache, allergies, cough, fever  Ear pain, sinus and eye infections Abdominal pain, nausea, vomiting, diarrhea, upset stomach Animal/insect bites  3 Easy Ways to Schedule: Walk-In Scheduling Call in scheduling Mychart Sign-up: [email or web address]         

## 2017-05-20 ENCOUNTER — Ambulatory Visit (HOSPITAL_COMMUNITY)
Admission: EM | Admit: 2017-05-20 | Discharge: 2017-05-20 | Disposition: A | Discharge status: Home / Self Care | Payer: Medicare Other | Attending: Family Medicine | Admitting: Family Medicine

## 2017-05-20 ENCOUNTER — Encounter (HOSPITAL_COMMUNITY): Payer: Self-pay | Admitting: Emergency Medicine

## 2017-05-20 DIAGNOSIS — W19XXXA Unspecified fall, initial encounter: Secondary | ICD-10-CM | POA: Diagnosis not present

## 2017-05-20 DIAGNOSIS — S80211A Abrasion, right knee, initial encounter: Secondary | ICD-10-CM | POA: Diagnosis not present

## 2017-05-20 NOTE — ED Triage Notes (Signed)
PT fell at [facility] yesterday and has abrasions to both knees.

## 2017-05-25 NOTE — ED Provider Notes (Signed)
  [facility]   [id number] 05/20/17 Arrival Time: 1936  ASSESSMENT & PLAN:  1. Abrasion, knee, right, initial encounter    Local wound care. No sign of infection. May f/u as needed. Reviewed expectations re: course of current medical issues. Questions answered. Outlined signs and symptoms indicating need for more acute intervention. Patient verbalized understanding. After Visit Summary given.   SUBJECTIVE:  Aaron Humphrey is a 66 y.o. male who presents with complaint of a fall yesterday on concrete. Skinned L knee. Mild bleeding that stopped shortly after injury. Ambulatory. No pain or swelling of knees reported. "Just wanted someone to look at it and make sure it's going to heal ok."   Immunization History  Administered Date(s) Administered  . Influenza Split 07/05/2012, 07/05/2013, 07/05/2014  . Influenza, High Dose Seasonal PF 06/15/2016  . Pneumococcal Conjugate-13 08/28/2015  . Tdap 05/04/2015    ROS: As per HPI.   OBJECTIVE:  Vitals:   05/20/17 1948 05/20/17 1949  BP: 133/69   Pulse: 67   Resp: 16   Temp: 99 F (37.2 C)   TempSrc: Oral   SpO2: 96%   Weight:  210 lb (95.3 kg)  Height:  6\' 1"  (1.854 m)     General appearance: alert; no distress Extremities: no cyanosis or edema; symmetrical with no gross deformities; L knee with 3x3cm abrasion; no active bleeding; FROM of both knees Skin: warm and dry Neurologic: normal gait Psychological:  alert and cooperative; normal mood and affect  Past Medical History:  Diagnosis Date  . Abdominal distension   . Abdominal pain   . CAD (coronary artery disease), native coronary artery 2002   severe 3 vessel s/p CABG with LIMA to LAD, RIMA to RCA and SVG to diag. // Myoview 5/18: EF 55, inf/inf-septal defect c/w small region of ischemia vs soft tissue atten; Low Risk  . Hypertension, essential, benign   . 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 Other and unspecified hyperlipidemia   .  Pre-diabetes   . RBBB 08/23/2015  . Sciatica   . Umbilical hernia   . Unspecified sleep apnea    cpap setting of 17, sees Dr. Baird Lyons      Allergies  Allergen Reactions  . Zithromax [Azithromycin] Hives and Shortness Of Breath  . Ampicillin Hives    Has patient had a PCN reaction causing immediate rash, facial/tongue/throat swelling, SOB or lightheadedness with hypotension: Yes Has patient had a PCN reaction causing severe rash involving mucus membranes or skin necrosis: Unknown Has patient had a PCN reaction that required hospitalization: No Has patient had a PCN reaction occurring within the last 10 years: Yes If all of the above answers are "NO", then may proceed with Cephalosporin use.     PMHx, SurgHx, SocialHx, Medications, and Allergies were reviewed in the Visit Navigator and updated as appropriate.      Vanessa Kick, MD 05/25/17 [phone]

## 2017-06-02 ENCOUNTER — Telehealth: Payer: Self-pay | Admitting: Internal Medicine

## 2017-06-02 NOTE — Telephone Encounter (Signed)
Called [facility] they stated they had confirmed with the patient on 05/14/17 when he came to the office that they had the order and were processing it. They stated Tiffany has the order I have called and left the patient a message and told him to call if any other questions.

## 2017-06-02 NOTE — Telephone Encounter (Signed)
Order was sent on 8/3 with documented confirmation by [facility].   Will route to [facility] for follow-up per triage protocol.

## 2017-06-14 DIAGNOSIS — Z23 Encounter for immunization: Secondary | ICD-10-CM | POA: Diagnosis not present

## 2017-06-20 ENCOUNTER — Encounter: Payer: Self-pay | Admitting: Internal Medicine

## 2017-06-24 ENCOUNTER — Encounter: Payer: Self-pay | Admitting: Family Medicine

## 2017-06-30 ENCOUNTER — Ambulatory Visit: Payer: Self-pay | Admitting: Family Medicine

## 2017-06-30 DIAGNOSIS — Z0289 Encounter for other administrative examinations: Secondary | ICD-10-CM

## 2017-07-07 ENCOUNTER — Ambulatory Visit (INDEPENDENT_AMBULATORY_CARE_PROVIDER_SITE_OTHER): Payer: Medicare Other | Admitting: Family Medicine

## 2017-07-07 ENCOUNTER — Encounter: Payer: Self-pay | Admitting: Family Medicine

## 2017-07-07 VITALS — BP 148/98 | HR 76 | Wt 226.0 lb

## 2017-07-07 DIAGNOSIS — I2510 Atherosclerotic heart disease of native coronary artery without angina pectoris: Secondary | ICD-10-CM

## 2017-07-07 DIAGNOSIS — I10 Essential (primary) hypertension: Secondary | ICD-10-CM

## 2017-07-07 MED ORDER — HYDROCHLOROTHIAZIDE 25 MG PO TABS: 25.0000 mg | ORAL_TABLET | Freq: Every day | ORAL | 3 refills | Status: DC

## 2017-07-07 MED ORDER — AMLODIPINE BESYLATE 5 MG PO TABS: 5.0000 mg | ORAL_TABLET | Freq: Every day | ORAL | 3 refills | Status: DC

## 2017-07-07 MED ORDER — METOPROLOL SUCCINATE ER 50 MG PO TB24: 100.0000 mg | ORAL_TABLET | Freq: Every day | ORAL | 3 refills | Status: DC

## 2017-07-07 NOTE — Progress Notes (Signed)
   Subjective:    Patient ID: Aaron Humphrey, male    DOB: 1950-12-09, 66 y.o.   MRN: [id number]  HPI Here to follow up on HTN. He has been taking 50 mg of metoprolol succinate daily and he has been feeling well, however his BPs remain high with systolics ranging from 696 to 190. His labs last time were fairly unremarkable, and his renal function is normal.   Review of Systems  Constitutional: Negative.   Respiratory: Negative.   Cardiovascular: Negative.   Neurological: Negative.        Objective:   Physical Exam  Constitutional: He appears well-developed and well-nourished.  Cardiovascular: Normal rate, regular rhythm, normal heart sounds and intact distal pulses.   Pulmonary/Chest: Effort normal and breath sounds normal. No respiratory distress. He has no wheezes. He has no rales.  Musculoskeletal:  1+ edema in both ankles           Assessment & Plan:  HTN. We will increase the metoprolol to 100 mg daily (he will take 2 tabs) in the evenings. We will add HCTZ 25 mg daily and Amlodipine 5 mg daily in the mornings. Recheck in 3 weeks.  Alysia Penna, MD

## 2017-07-07 NOTE — Patient Instructions (Signed)
WE NOW OFFER   [facility] FAST TRACK!!!  SAME DAY Appointments for ACUTE CARE  Such as: Sprains, Injuries, cuts, abrasions, rashes, muscle pain, joint pain, back pain Colds, flu, sore throats, headache, allergies, cough, fever  Ear pain, sinus and eye infections Abdominal pain, nausea, vomiting, diarrhea, upset stomach Animal/insect bites  3 Easy Ways to Schedule: Walk-In Scheduling Call in scheduling Mychart Sign-up: [email or web address]         

## 2017-07-09 DIAGNOSIS — M21961 Unspecified acquired deformity of right lower leg: Secondary | ICD-10-CM | POA: Diagnosis not present

## 2017-07-09 DIAGNOSIS — M21962 Unspecified acquired deformity of left lower leg: Secondary | ICD-10-CM | POA: Diagnosis not present

## 2017-07-09 DIAGNOSIS — L602 Onychogryphosis: Secondary | ICD-10-CM | POA: Diagnosis not present

## 2017-07-09 DIAGNOSIS — E1351 Other specified diabetes mellitus with diabetic peripheral angiopathy without gangrene: Secondary | ICD-10-CM | POA: Diagnosis not present

## 2017-07-20 ENCOUNTER — Telehealth: Payer: Self-pay | Admitting: Family Medicine

## 2017-07-20 NOTE — Telephone Encounter (Signed)
Wife would like to know if pt should take his bp meds tonight and/or in the morning, due to all the sweating pt is doing today. She states pt's bp still a little high.  Pt has appt tomorrow afternoon at 3:30 pm

## 2017-07-20 NOTE — Telephone Encounter (Signed)
Pt is wanting to know if it is okay for him to stop amlodipine due to the sweating in his head during the day and getting in his face and it is really getting on his nerves.  Pt would like to have a call back to discuss the matter.

## 2017-07-20 NOTE — Telephone Encounter (Signed)
Have him make an OV to follow up. We made 3 changes to his BP meds last time so we need to reconsider everything.

## 2017-07-20 NOTE — Telephone Encounter (Signed)
Due to power still being out, patient's wife requested a call back on their cell phone call was lost  Number on caller id is [phone]

## 2017-07-20 NOTE — Telephone Encounter (Signed)
I would prefer he take all the medications as prescribed so we can get a better sense of what's going on tomorrow

## 2017-07-21 ENCOUNTER — Ambulatory Visit (INDEPENDENT_AMBULATORY_CARE_PROVIDER_SITE_OTHER): Payer: Medicare Other | Admitting: Family Medicine

## 2017-07-21 ENCOUNTER — Encounter: Payer: Self-pay | Admitting: Family Medicine

## 2017-07-21 VITALS — BP 135/91 | HR 73 | Temp 98.1°F | Ht 73.0 in | Wt 223.0 lb

## 2017-07-21 DIAGNOSIS — I10 Essential (primary) hypertension: Secondary | ICD-10-CM

## 2017-07-21 DIAGNOSIS — I2510 Atherosclerotic heart disease of native coronary artery without angina pectoris: Secondary | ICD-10-CM | POA: Diagnosis not present

## 2017-07-21 MED ORDER — ISRADIPINE 5 MG PO CAPS: 5.0000 mg | ORAL_CAPSULE | Freq: Two times a day (BID) | ORAL | 3 refills | Status: AC

## 2017-07-21 MED ORDER — HYDROCHLOROTHIAZIDE 25 MG PO TABS: 25.0000 mg | ORAL_TABLET | Freq: Every day | ORAL | 3 refills | Status: AC

## 2017-07-21 NOTE — Progress Notes (Signed)
   Subjective:    Patient ID: Aaron Humphrey, male    DOB: [birth year], 66 y.o.   MRN: [id number]  HPI Here to follow up HTN and to discuss medication effects. We saw him on 07-07-17 with a BP of 148/98 when he was taking Metoprolol succinate 50 mg daily. We decided to increase the Metoprolol to 100 mg a day and we added Amlodipine and HCTZ. Since then the BP has come down a little but is not yet to goal. He has been getting readings of 130s to 140s over low 90s. His main complaint is excessive sweating that started a few days after our last visit. Most of the sweating involves th head and neck area and he has been soaking his pillowcase with sweat every night. He notes that he took Nifedipine some years ago and tolerated it well.    Review of Systems  Constitutional: Positive for diaphoresis. Negative for chills and fever.  Respiratory: Negative.   Cardiovascular: Negative.   Neurological: Negative.        Objective:   Physical Exam  Constitutional: He is oriented to person, place, and time. He appears well-developed and well-nourished.  Cardiovascular: Normal rate, regular rhythm, normal heart sounds and intact distal pulses.   Pulmonary/Chest: Effort normal and breath sounds normal. No respiratory distress. He has no wheezes. He has no rales.  Musculoskeletal: He exhibits no edema.  Neurological: He is alert and oriented to person, place, and time.          Assessment & Plan:  His HTN is partially treated and he has been sweating as a likely side effect. It is not clear what may be causing this. We agreed to stop the Amlodipine and the Metoprolol completely. Stay on HCTZ. Try Isradipine 5 mg bid. He will report back to [place] in 3 weeks. Alysia Penna, MD

## 2017-07-21 NOTE — Telephone Encounter (Signed)
I tried to call again, no answer, will discuss during office visit today.

## 2017-07-21 NOTE — Patient Instructions (Signed)
WE NOW OFFER   [facility] FAST TRACK!!!  SAME DAY Appointments for ACUTE CARE  Such as: Sprains, Injuries, cuts, abrasions, rashes, muscle pain, joint pain, back pain Colds, flu, sore throats, headache, allergies, cough, fever  Ear pain, sinus and eye infections Abdominal pain, nausea, vomiting, diarrhea, upset stomach Animal/insect bites  3 Easy Ways to Schedule: Walk-In Scheduling Call in scheduling Mychart Sign-up: [email or web address]         

## 2017-07-23 ENCOUNTER — Other Ambulatory Visit: Payer: Self-pay | Admitting: Internal Medicine

## 2017-07-23 MED ORDER — METHYLPHENIDATE HCL ER 20 MG PO TBCR: EXTENDED_RELEASE_TABLET | ORAL | 0 refills | Status: DC

## 2017-07-23 NOTE — Telephone Encounter (Signed)
RX has printed. Will place on CY's chart for him to sign. Will notify patient once it has been signed.

## 2017-07-23 NOTE — Telephone Encounter (Signed)
I have called the number and advised that the rx is ready to be picked up and this has been placed up front. Nothing further is needed.

## 2017-07-23 NOTE — Telephone Encounter (Signed)
Ok to refill 

## 2017-07-23 NOTE — Telephone Encounter (Signed)
Spoke with patient. He is requesting a refill on his methylphenidate 20mg . States he would like to pick up this prescription when it is ready.   Last OV: 05/07/17 Next OV:08/12/17 Last RX: 05/05/17 for 120 tablets. Take 1-4 tablets as needed.   CY, please advise if this refill is appropriate. Thanks!

## 2017-08-11 ENCOUNTER — Encounter: Payer: Self-pay | Admitting: Internal Medicine

## 2017-08-12 ENCOUNTER — Ambulatory Visit (INDEPENDENT_AMBULATORY_CARE_PROVIDER_SITE_OTHER): Payer: Medicare Other | Admitting: Internal Medicine

## 2017-08-12 ENCOUNTER — Encounter: Payer: Self-pay | Admitting: Internal Medicine

## 2017-08-12 DIAGNOSIS — I2510 Atherosclerotic heart disease of native coronary artery without angina pectoris: Secondary | ICD-10-CM

## 2017-08-12 DIAGNOSIS — G47411 Narcolepsy with cataplexy: Secondary | ICD-10-CM | POA: Diagnosis not present

## 2017-08-12 DIAGNOSIS — G4733 Obstructive sleep apnea (adult) (pediatric): Secondary | ICD-10-CM

## 2017-08-12 NOTE — Progress Notes (Signed)
HPI Aaron Humphrey never smoker followed for OSA, narcolepsy/cataplexy, complicated by HBP, allergic rhinitis, CAD/CABG NPSG 05/14/2005-AHI 44/hour, desaturation to 80% MSLT 06/23/2010- mean latency 1.4 minutes, SOREM 0  ------------------------------------------------------------------------------------------------------  05/07/17- 65 yoM never smoker followed for OSA with narcolepsy/cataplexy, complicated by HBP, allergic rhinitis, CAD/CAB CPAP 13/Advanced today> new cpap auto 5-20 FOLLOWS FOR: DME [facility]. Pt wears CPAP nightly and feels pressure needs to be adjusted. ? time for new machine. No SD card in CPAP and not in [facility] Metadate 20 ER mg 1-4 tabs daily as needed- usually takes between 2 and 3 tablets daily. Wife says if she startles him he is apt to get limp and fall so she has learned to talk as she comes up behind him. This suggests cataplexy which is more evident now than years ago. Still very tired through much of the day. He tries to maintain good sleep habits and naps.  08/12/17-  66 yoM never smoker followed for OSA with narcolepsy/cataplexy(?), complicated by HBP, allergic rhinitis, CAD/CAB CPAP auto 5-20/ [facility] Pt states that he has been doing well. No complaints with his CPAP machine. DME: [facility]. utd flu vax, Prevnar Download 70% 4-hour compliance averaging 4-1/2 hours per night.  Residual AHI 12.0 likely reflects central apneas.  ROS-see HPI  + = pos Constitutional:   No-   weight loss, night sweats, fevers, chills, +fatigue, lassitude. HEENT:   No-  headaches, difficulty swallowing, tooth/dental problems, sore throat,       No-  sneezing, itching, ear ache, nasal congestion, post nasal drip,  CV:  No-   chest pain, orthopnea, PND, swelling in lower extremities, anasarca, dizziness, palpitations Resp: No-   shortness of breath with exertion or at rest.              No-   productive cough,  No non-productive cough,  No- coughing up of blood.              No-   change in color of mucus.   No- wheezing.   Skin: No-   rash or lesions. GI:  No-   heartburn, indigestion, abdominal pain, nausea, vomiting,                 change in bowel habits, loss of appetite GU: MS:  Neuro-     nothing unusual Psych:  No- change in mood or affect. No depression or anxiety.  No memory loss.  OBJ General- Alert/ laconic, Oriented, Affect-appropriate, Distress- none acute. Skin- rash-none, lesions- none, excoriation- none Lymphadenopathy- none Head- atraumatic            Eyes- Gross vision intact, PERRLA, conjunctivae clear secretions            Ears- Hearing, canals-normal            Nose- Clear, no-Septal dev, mucus, polyps, erosion, perforation             Throat- Mallampati III , mucosa clear , drainage- none, tonsils- atrophic Neck- flexible , trachea midline, no stridor , thyroid nl, carotid no bruit Chest - symmetrical excursion , unlabored           Heart/CV- RRR , no murmur , no gallop  , no rub, nl s1 s2                           - JVD- none , edema- none, stasis changes- none, varices- none  Lung- clear to P&A, wheeze- none, cough- none , dullness-none, rub- none           Chest wall-  Abd- Br/ Gen/ Rectal- Not done, not indicated Extrem- cyanosis- none, clubbing, none, atrophy- none, strength- nl Neuro- grossly intact to observation

## 2017-08-12 NOTE — Patient Instructions (Signed)
We can continue CPAP auto 5-20, mask of choice, humidifier, supplies, [facility]View   Dx OSA  Please call if we can help

## 2017-08-14 NOTE — Assessment & Plan Note (Signed)
Residual daytime sleepiness is controlled with Metadate there has been no concern of misuse or diversion.

## 2017-08-14 NOTE — Assessment & Plan Note (Signed)
He continues with CPAP.  We emphasized compliance and mask fit in discussions today

## 2017-09-01 ENCOUNTER — Other Ambulatory Visit: Payer: Self-pay | Admitting: Internal Medicine

## 2017-09-01 MED ORDER — METHYLPHENIDATE HCL ER 20 MG PO TBCR: EXTENDED_RELEASE_TABLET | ORAL | 0 refills | Status: DC

## 2017-09-01 NOTE — Telephone Encounter (Signed)
Refill request for Methylphenidate 20mg  ER tablet. CY please advise.  Last OV 08/10/2017 Last RX 07/23/2017  Current Outpatient Medications on File Prior to Visit  Medication Sig Dispense Refill  . amLODipine (NORVASC) 5 MG tablet     . aspirin EC 81 MG tablet Take 1 tablet (81 mg total) by mouth daily. 30 tablet 0  . atorvastatin (LIPITOR) 40 MG tablet Take 1 tablet (40 mg total) by mouth daily at 6 PM. 90 tablet 3  . hydrochlorothiazide (HYDRODIURIL) 25 MG tablet Take 1 tablet (25 mg total) by mouth daily. 30 tablet 3  . isradipine (DYNACIRC) 5 MG capsule Take 1 capsule (5 mg total) by mouth 2 (two) times daily. (Patient not taking: Reported on 08/12/2017) 60 capsule 3  . methylphenidate (METADATE ER) 20 MG ER tablet Take 1-4 tablets daily as needed 120 tablet 0  . Multiple Vitamin (MULTIVITAMIN) tablet Take 1 tablet by mouth daily.      . Naproxen Sodium (ALEVE) 220 MG CAPS Take 220 mg by mouth daily as needed (pain).      No current facility-administered medications on file prior to visit.    Allergies  Allergen Reactions  . Zithromax [Azithromycin] Hives and Shortness Of Breath  . Ampicillin Hives    Has patient had a PCN reaction causing immediate rash, facial/tongue/throat swelling, SOB or lightheadedness with hypotension: Yes Has patient had a PCN reaction causing severe rash involving mucus membranes or skin necrosis: Unknown Has patient had a PCN reaction that required hospitalization: No Has patient had a PCN reaction occurring within the last 10 years: Yes If all of the above answers are "NO", then may proceed with Cephalosporin use.

## 2017-09-01 NOTE — Telephone Encounter (Signed)
Rx has been printed and placed on CY chart for signature.  CY please return to triage once signed. Thanks.

## 2017-09-01 NOTE — Telephone Encounter (Signed)
LM advising Rx ready to pick up.

## 2017-09-01 NOTE — Telephone Encounter (Signed)
Ok to refill 

## 2017-09-27 ENCOUNTER — Telehealth: Payer: Self-pay | Admitting: Internal Medicine

## 2017-09-27 NOTE — Telephone Encounter (Signed)
lmtcb x1 for pt. 

## 2017-09-29 NOTE — Telephone Encounter (Signed)
Spoke with patient. He is requesting a refill on methylphenidate 20mg .   Last OV: 08/12/17 Next OV: 02/17/17 Last RX: 09/01/17 for 120 tablets  CY, please advise if this refill is appropriate for patient. Thanks!

## 2017-09-29 NOTE — Telephone Encounter (Signed)
Ok to refill 120 

## 2017-09-29 NOTE — Telephone Encounter (Signed)
Refill request for Adderall? He was last prescribed Methadate. I called pt to clarify. LM to call back.   Last OV 08/12/2017 Last refill 09/01/2017

## 2017-09-29 NOTE — Telephone Encounter (Signed)
Patient returned call...Marland Kitchenoriginal phone call the patient was requesting generic Ritalin, methylphenidate (not adderall). CB is [phone] cell.

## 2017-09-30 MED ORDER — METHYLPHENIDATE HCL ER 20 MG PO TBCR: EXTENDED_RELEASE_TABLET | ORAL | 0 refills | Status: AC

## 2017-09-30 NOTE — Telephone Encounter (Signed)
RX has been printed and placed on CY's chart. Will await's pt's call back to see if he wants to pick this up or wants this mailed.

## 2017-09-30 NOTE — Telephone Encounter (Signed)
lmtcb x1 for pt. 

## 2017-09-30 NOTE — Telephone Encounter (Signed)
Spoke with patient. Advised him that the RX was signed and ready for pickup. He stated that he would stop by the office this afternoon. Will leave RX up front for him.   Nothing else needed at time of call. Will close this message.

## 2017-09-30 NOTE — Telephone Encounter (Signed)
Pt is calling back [phone]

## 2017-10-25 ENCOUNTER — Emergency Department (HOSPITAL_COMMUNITY)
Admission: EM | Admit: 2017-10-25 | Discharge: 2017-11-05 | Disposition: E | Payer: Medicare Other | Attending: Emergency Medicine | Admitting: Emergency Medicine

## 2017-10-25 DIAGNOSIS — I469 Cardiac arrest, cause unspecified: Secondary | ICD-10-CM | POA: Insufficient documentation

## 2017-10-25 DIAGNOSIS — R231 Pallor: Secondary | ICD-10-CM | POA: Insufficient documentation

## 2017-10-25 DIAGNOSIS — I2510 Atherosclerotic heart disease of native coronary artery without angina pectoris: Secondary | ICD-10-CM | POA: Diagnosis not present

## 2017-10-25 DIAGNOSIS — Z951 Presence of aortocoronary bypass graft: Secondary | ICD-10-CM | POA: Insufficient documentation

## 2017-10-25 MED ORDER — ATROPINE SULFATE 1 MG/ML IJ SOLN
INTRAMUSCULAR | Status: AC | PRN
  Administered 2017-10-25 (×2): 1 mg via INTRAVENOUS

## 2017-10-25 MED ORDER — CALCIUM CHLORIDE 10 % IV SOLN
INTRAVENOUS | Status: AC | PRN
  Administered 2017-10-25 (×2): 1 g via INTRAVENOUS

## 2017-10-25 MED ORDER — EPINEPHRINE PF 1 MG/10ML IJ SOSY
PREFILLED_SYRINGE | INTRAMUSCULAR | Status: AC | PRN
  Administered 2017-10-25 (×7): 1 via INTRAVENOUS

## 2017-10-25 MED ORDER — SODIUM BICARBONATE 8.4 % IV SOLN
INTRAVENOUS | Status: AC | PRN
  Administered 2017-10-25 (×2): 50 meq via INTRAVENOUS

## 2017-10-25 MED ORDER — SODIUM CHLORIDE 0.9 % IV SOLN
INTRAVENOUS | Status: AC | PRN
  Administered 2017-10-25: 1000 mL via INTRAVENOUS

## 2017-10-25 NOTE — Code Documentation (Signed)
Pulse check, no pulses, PEA on monitor, CPR resumed.

## 2017-10-25 NOTE — Code Documentation (Signed)
Patient time of death occurred at 01-19-2012 called by Dr Darl Householder.

## 2017-10-25 NOTE — Code Documentation (Signed)
Pulse check, PEA on monitor, cpr resumed.

## 2017-10-25 NOTE — Code Documentation (Signed)
Pulse check, no pulse - PEA on monitor, cpr resumed, blood suctioned from airway.  Moderate amount.

## 2017-10-25 NOTE — Code Documentation (Signed)
Pulse check, no pulses, PEA on monitor, cpr resumed.

## 2017-10-25 NOTE — Code Documentation (Signed)
Breathing tube placed by EMS dislodged and removed.

## 2017-10-25 NOTE — ED Provider Notes (Signed)
[facility] EMERGENCY DEPARTMENT Provider Note   CSN: [id number] Arrival date & time: 10/06/2017  1950     History   Chief Complaint No chief complaint on file.   HPI Aaron Humphrey is a 67 y.o. male history of coronary artery disease with CABG 16 years ago here presenting with cardiac arrest.  Per the wife, patient had chest pain throughout this week.  Patient did not feel well today and was complaining of chest pain and keep on telling the wife "we have to go"and appear very confused today.  He did not eat and drink anything today and just was laying in his chair.  She noticed that he had very thready pulse and then became unresponsive.  She started chest compressions on him and EMS was notified.  When EMS got there he was in PEA arrest.  Patient was intubated by EMS. ACLS was performed for an hour and patient was given 10 epi by EMS.   The history is provided by the spouse and the EMS personnel.    Level V caveat- cardiac arrest    No past medical history on file.  There are no active problems to display for this patient.       Home Medications    Prior to Admission medications   Not on File    Family History No family history on file.  Social History Social History   Tobacco Use  . Smoking status: Not on file  Substance Use Topics  . Alcohol use: Not on file  . Drug use: Not on file     Allergies   Patient has no allergy information on record.   Review of Systems Review of Systems  Unable to perform ROS: Intubated  All other systems reviewed and are negative.    Physical Exam Updated Vital Signs There were no vitals taken for this visit.  Physical Exam  Constitutional:  Intubated. Lethargic   HENT:  Head: Normocephalic.  Eyes: Conjunctivae and EOM are normal. Pupils are equal, round, and reactive to light.  Cardiovascular:  Cardiac arrest   Pulmonary/Chest:  No breath sounds bilaterally   Abdominal: Soft. Bowel  sounds are normal.  Musculoskeletal: Normal range of motion.  Neurological:  Lethargic   Skin: There is pallor.  Cold, pale   Psychiatric:  Unable   Nursing note and vitals reviewed.    ED Treatments / Results  Labs (all labs ordered are listed, but only abnormal results are displayed) Labs Reviewed - No data to display  EKG  EKG Interpretation None       Radiology No results found.  Procedures Procedures (including critical care time)  Cardiopulmonary Resuscitation (CPR) Procedure Note Directed/Performed by: Wandra Arthurs I personally directed ancillary staff and/or performed CPR in an effort to regain return of spontaneous circulation and to maintain cardiac, neuro and systemic perfusion.   INTUBATION Performed by: Wandra Arthurs  Required items: required blood products, implants, devices, and special equipment available Patient identity confirmed: provided demographic data and hospital-assigned identification number Time out: Immediately prior to procedure a "time out" was called to verify the correct patient, procedure, equipment, support staff and site/side marked as required.  Indications: cardiac arrest  Intubation method: Glidescope Laryngoscopy   Preoxygenation: BVM  Sedatives: none Paralytic: none  Tube Size: 7.5 cuffed  Post-procedure assessment: chest rise and ETCO2 monitor Breath sounds: equal and absent over the epigastrium Tube secured with: ETT holder   Patient tolerated the procedure well with no  immediate complications.    EMERGENCY DEPARTMENT [place] CARDIAC EXAM "Study: Limited Ultrasound of the Heart and Pericardium"  INDICATIONS:Cardiac arrest Multiple views of the heart and pericardium were obtained in real-time with a multi-frequency probe.  PERFORMED BF:XOVANV IMAGES ARCHIVED?: No LIMITATIONS:  Body habitus VIEWS USED: Parasternal long axis INTERPRETATION: Cardiac activity absent    Medications Ordered in ED Medications    EPINEPHrine (ADRENALIN) 1 MG/10ML injection (1 Syringe Intravenous Given 10/09/2017 2010)  sodium bicarbonate injection (50 mEq Intravenous Given 10/22/2017 1959)  calcium chloride injection (1 g Intravenous Given 10/29/2017 2004)  atropine injection (1 mg Intravenous Given 10/13/2017 2005)     Initial Impression / Assessment and Plan / ED Course  I have reviewed the triage vital signs and the nursing notes.  Pertinent labs & imaging results that were available during my care of the patient were reviewed by me and considered in my medical decision making (see chart for details).     Aaron Humphrey is a 67 y.o. male here with cardiac arrest. Patient had chest pain this week and has hx of CAD s/p CABG 16 years ago. Was in PEA. Intubated by EMS. I was unable to get much breath sounds on arrival. I checked with glide scope and the ET tube was in the esophagus. I was able to remove the tube and intubated with 7.5 ET tube. I continued ACLS and patient remained in PEA. I was unable to regain ROSC. Patient expired at 8:13 pm. I suspect cardiac arrest from MI from underlying CAD. No signs of trauma. He is not ME case. I updated wife. She doesn't remember PCP. I filled out death certificate.    Final Clinical Impressions(s) / ED Diagnoses   Final diagnoses:  None    ED Discharge Orders    None       Drenda Freeze, MD 10/14/2017 2118

## 2017-10-25 NOTE — Procedures (Signed)
Intubation Procedure Note MONT JAGODA [id number] 10/31/1950  Procedure: Intubation Indications: Respiratory insufficiency  Procedure Details Consent: Unable to obtain consent because of emergent medical necessity. Time Out: Verified patient identification, verified procedure, site/side was marked, verified correct patient position, special equipment/implants available, medications/allergies/relevent history reviewed, required imaging and test results available.  Performed  Maximum sterile technique was used including antiseptics, gloves, hand hygiene and mask.  MAC and 4    Evaluation Hemodynamic Status: Transient hypotension treated with pressors; O2 sats: unstable Patient's Current Condition: unstable Complications: No apparent complications Patient did tolerate procedure well. Chest X-ray ordered to verify placement.  CXR: pending.  Patient arrives via EMS CPR in progress revealing cardiogenic shock. Per EMS report. Pt was successfully intubated.  Airway was assess per this RRT and MD. Noted no Color change on the ETCO2 detector and did not note any chest rise while manually bag ventilation. ETT was quickly removed RRT and re-intubated successfully using Glidescope Video per this RRT. ETT was visually seen passing through the vocal cords w/o complications. Post intubation ETCO2 detector was used noted positive color change, noted visual bilaterally chest rise. BBS was auscultated per MD. BBS confirmed equal per MD Darl Householder. Condensation in the ETT noted per RRT. Pt is clinically hemodynamically unstable. Pt is centrally cyanotic all around his face. Hypotensive. No return is ROSC. Time of death 12/06/2011 called by Dr Darl Householder.    Leigh Aurora, BS, RRT, RCP 10/19/2017

## 2017-10-25 NOTE — Code Documentation (Signed)
Pulse check, PEA on monitor, CPR resumed.  

## 2017-10-25 NOTE — Code Documentation (Signed)
Pulse check, no pulse, PEA on monitor, cpr resumed.

## 2017-10-25 NOTE — Code Documentation (Signed)
Pulse check, PEA on monitor, rate of 12.  Ultrasound to bedside, no cardiac activity.

## 2017-10-25 NOTE — ED Notes (Signed)
Patient arrives via [place] on [address] in cardiac arrest.  EMS states that patient was feeling poorly, had moments prior to collapse of anxiety stating "lets go", then collapsed.  Patient was down approx 10 mins with bystander CPR.  Upon EMS arrival, patient was in asystole.  CPR continued, ROSC obtained briefly and then patient lost pulses and CPR resumed.  Patient was given 10 epi en route to ED.  Upon arrival, face and head are cyanotic, patient is intubated with I/O and IV in place.

## 2017-10-25 NOTE — Code Documentation (Signed)
Pulse check, PEA in 90's on monitor, no pulses, CPR resumed.

## 2017-11-05 DEATH — deceased

## 2018-02-17 ENCOUNTER — Ambulatory Visit: Payer: Self-pay | Admitting: Internal Medicine

## 2018-04-23 IMAGING — CR DG CHEST 2V
2 series · 2 of 2 positions shown · non-contrast
Comparison: 01/29/2001 report .

CLINICAL DATA: Umbilical hernia repair.  Preoperative chest x-ray.

EXAM:
CHEST  2 VIEW

[w chest pa]
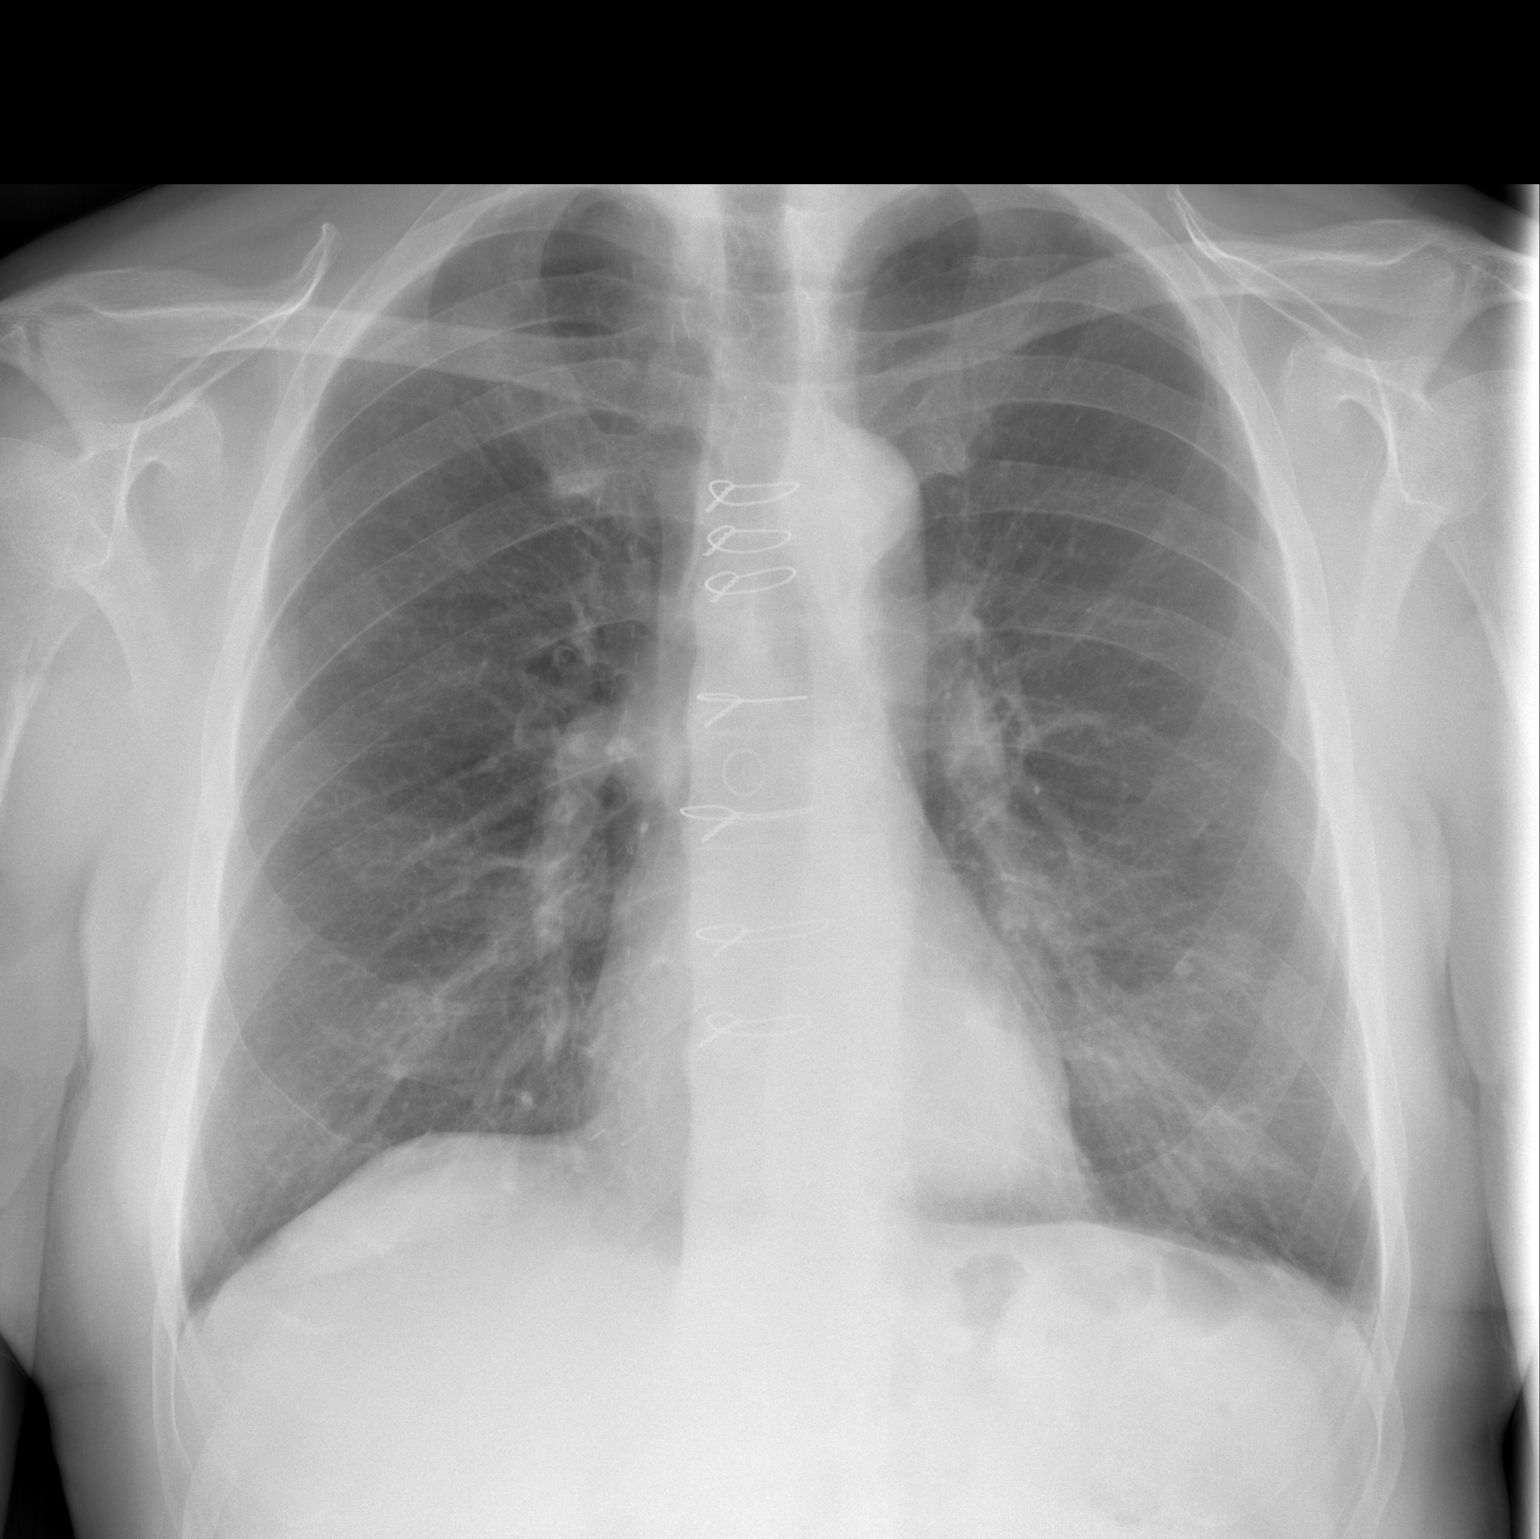

[w chest lat]
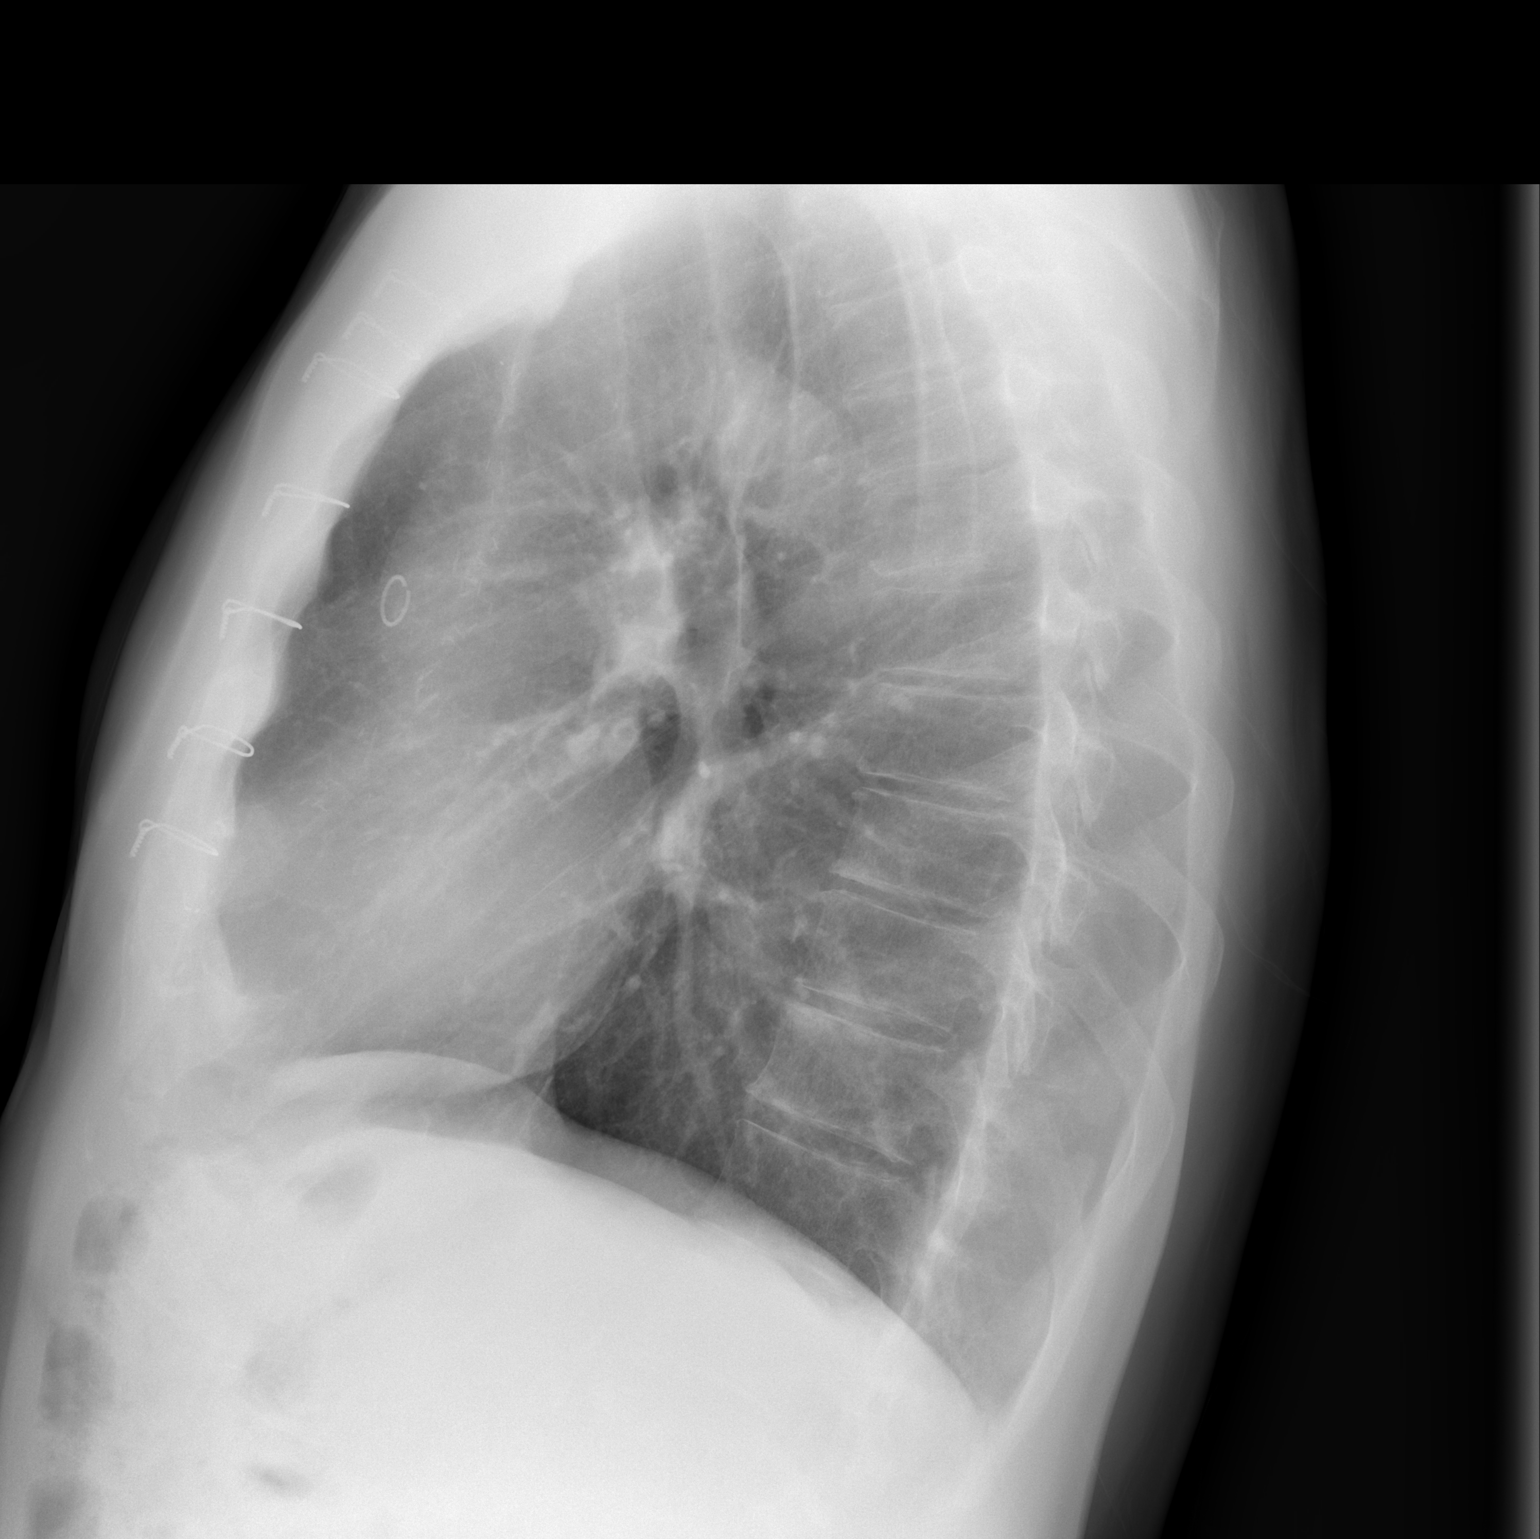

[2 of 2 positions shown; findings below may reference images not displayed]

FINDINGS: Prior CABG. Heart size normal. No focal infiltrate. Calcified nodule
left mid lung consistent granuloma. No pleural effusion or
pneumothorax. No acute bony abnormality .
IMPRESSION: Prior CABG.  No acute pulmonary disease.

## 2018-11-18 IMAGING — NM NM MISC PROCEDURE
3 series · 18 of 18 positions shown · non-contrast
Comparison: none

[Series 1: rest_(id)_sa · 6.4mm · 6.40mm/px · 6 of 64 frames shown]
[frame 6/64]
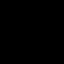
[frame 16/64]
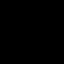
[frame 27/64]
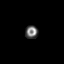
[frame 38/64]
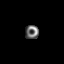
[frame 48/64]
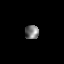
[frame 59/64]
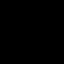

[Series 1: stress-gsp_(id)_sa · 6.4mm · 6.40mm/px · 6 of 512 frames shown]
[frame 43/512]
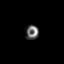
[frame 128/512]
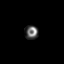
[frame 214/512]
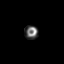
[frame 299/512]
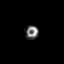
[frame 384/512]
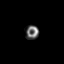
[frame 470/512]
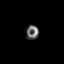

[Series 1: stress-sum-em_(id)_sa · 6.4mm · 6.40mm/px · 6 of 64 frames shown]
[frame 6/64]
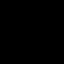
[frame 16/64]
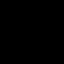
[frame 27/64]
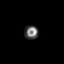
[frame 38/64]
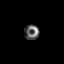
[frame 48/64]
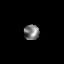
[frame 59/64]
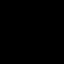

[18 of 18 positions shown; findings below may reference images not displayed]

Canned report from images found in remote index.

Refer to host system for actual result text.
